# Patient Record
Sex: Female | Born: 1990 | Race: White | Hispanic: No | Marital: Married | Smoking: Never smoker
Health system: Southern US, Community
[De-identification: ages and names within clinical notes are randomized; demographics above are authoritative.]

## PROBLEM LIST (undated history)

## (undated) DIAGNOSIS — Z789 Other specified health status: Secondary | ICD-10-CM

## (undated) HISTORY — PX: MOUTH SURGERY: SHX715

## (undated) HISTORY — PX: NO PAST SURGERIES: SHX2092

---

## 2013-03-02 NOTE — L&D Delivery Note (Signed)
Delivery Note At 7:51 PM a viable female was delivered via SVD. Presentation:OA with mother on hands/knees in birthing tub.  APGAR: 8, 9; weight pending .   Placenta status: spontaneous, intact.  Cord: 3V with the following complications :none.  Anesthesia: locatl 1% xylocaine Episiotomy: None Lacerations: small midline 2nd degree and full thickness left labium minorum Suture Repair: 3.0 vicryl rapide Est. Blood Loss (mL):  300cc  Mom to postpartum.  Baby to Couplet care / Skin to Skin.  Amy Franklin 01/17/2014, 8:34 PM

## 2013-06-16 ENCOUNTER — Ambulatory Visit (INDEPENDENT_AMBULATORY_CARE_PROVIDER_SITE_OTHER): Payer: BC Managed Care – PPO | Admitting: Advanced Practice Midwife

## 2013-06-16 ENCOUNTER — Encounter: Payer: Self-pay | Admitting: Advanced Practice Midwife

## 2013-06-16 VITALS — BP 113/67 | Temp 97.6°F | Ht 60.0 in | Wt 122.0 lb

## 2013-06-16 DIAGNOSIS — Z3400 Encounter for supervision of normal first pregnancy, unspecified trimester: Secondary | ICD-10-CM | POA: Insufficient documentation

## 2013-06-16 NOTE — Progress Notes (Signed)
[id number]   Bedside U/S showed IUP with CRL 23.225mm and GA [redacted]W[redacted]D

## 2013-06-16 NOTE — Progress Notes (Signed)
   Subjective:    Amy Franklin is a G1P0 459w1d being seen today for her first obstetrical visit.  Her obstetrical history is significant for none G1. Patient does intend to breast feed. Pregnancy history fully reviewed.  Patient reports no complaints.  Filed Vitals:   06/16/13 1009 06/16/13 1014  BP: 113/67   Temp: 97.6 F (36.4 C)   Height:  5' (1.524 m)  Weight: 122 lb (55.339 kg)     HISTORY: OB History  Gravida Para Term Preterm AB SAB TAB Ectopic Multiple Living  1             # Outcome Date GA Lbr Len/2nd Weight Sex Delivery Anes PTL Lv  1 CUR              No past medical history on file. Past Surgical History  Procedure Laterality Date  . Mouth surgery     Family History  Problem Relation Age of Onset  . Hypertension Father   . Hypertension Paternal Grandfather   . Cancer - Colon Paternal Grandfather   . Diabetes Maternal Grandfather      Exam    Uterus:     Pelvic Exam: Deferred  System: Breast:  Deferred   Skin: normal coloration and turgor, no rashes    Neurologic: oriented, normal, gait normal; reflexes normal and symmetric   Extremities: normal strength, tone, and muscle mass, ROM of all joints is normal   HEENT neck supple with midline trachea and thyroid without masses   Mouth/Teeth mucous membranes moist, pharynx normal without lesions and dental hygiene good   Neck supple and no masses   Cardiovascular: regular rate and rhythm   Respiratory:  appears well, vitals normal, no respiratory distress, acyanotic, normal RR, ear and throat exam is normal, neck free of mass or lymphadenopathy, chest clear, no wheezing, crepitations, rhonchi, normal symmetric air entry   Abdomen: soft, non-tender; bowel sounds normal; no masses,  no organomegaly   Urinary: urethral meatus normal      Assessment:    Pregnancy: G1P0 Patient Active Problem List   Diagnosis Date Noted  . Supervision of normal first pregnancy 06/16/2013        Plan:     Initial  labs drawn. Prenatal vitamins. Problem list reviewed and updated. Genetic Screening discussed First Screen and Quad Screen: declined.  Ultrasound discussed; fetal survey: requested.  Follow up in 4 weeks. 50% of 30 min visit spent on counseling and coordination of care.     Wilmer FloorLisa A Leftwich-Kirby 06/16/2013

## 2013-06-17 LAB — OBSTETRIC PANEL
Antibody Screen: NEGATIVE
Basophils Absolute: 0 10*3/uL (ref 0.0–0.1)
Basophils Relative: 0 % (ref 0–1)
EOS ABS: 0 10*3/uL (ref 0.0–0.7)
Eosinophils Relative: 0 % (ref 0–5)
HCT: 37.4 % (ref 36.0–46.0)
HEMOGLOBIN: 13.4 g/dL (ref 12.0–15.0)
HEP B S AG: NEGATIVE
LYMPHS PCT: 16 % (ref 12–46)
Lymphs Abs: 1.8 10*3/uL (ref 0.7–4.0)
MCH: 31.3 pg (ref 26.0–34.0)
MCHC: 35.8 g/dL (ref 30.0–36.0)
MCV: 87.4 fL (ref 78.0–100.0)
MONOS PCT: 8 % (ref 3–12)
Monocytes Absolute: 0.9 10*3/uL (ref 0.1–1.0)
Neutro Abs: 8.5 10*3/uL — ABNORMAL HIGH (ref 1.7–7.7)
Neutrophils Relative %: 76 % (ref 43–77)
Platelets: 256 10*3/uL (ref 150–400)
RBC: 4.28 MIL/uL (ref 3.87–5.11)
RDW: 13.6 % (ref 11.5–15.5)
RH TYPE: POSITIVE
RUBELLA: 5.57 {index} — AB (ref <0.90)
WBC: 11.2 10*3/uL — AB (ref 4.0–10.5)

## 2013-06-17 LAB — GC/CHLAMYDIA PROBE AMP
CT PROBE, AMP APTIMA: NEGATIVE
GC Probe RNA: NEGATIVE

## 2013-06-17 LAB — HIV ANTIBODY (ROUTINE TESTING W REFLEX): HIV 1&2 Ab, 4th Generation: NONREACTIVE

## 2013-06-19 LAB — CULTURE, URINE COMPREHENSIVE: Colony Count: 1000

## 2013-07-21 ENCOUNTER — Ambulatory Visit (INDEPENDENT_AMBULATORY_CARE_PROVIDER_SITE_OTHER): Payer: BC Managed Care – PPO | Admitting: Family

## 2013-07-21 VITALS — BP 115/70 | HR 93 | Wt 126.0 lb

## 2013-07-21 DIAGNOSIS — N390 Urinary tract infection, site not specified: Secondary | ICD-10-CM

## 2013-07-21 DIAGNOSIS — Z3400 Encounter for supervision of normal first pregnancy, unspecified trimester: Secondary | ICD-10-CM

## 2013-07-21 NOTE — Progress Notes (Signed)
Reviewed lab results.  Discussed quickening and what to expect.  Schedule anatomy ultrasound for next 4-5 weeks.

## 2013-07-21 NOTE — Addendum Note (Signed)
Addended by: Granville Lewis on: 07/21/2013 09:36 AM   Modules accepted: Orders

## 2013-07-23 LAB — CULTURE, URINE COMPREHENSIVE
COLONY COUNT: NO GROWTH
ORGANISM ID, BACTERIA: NO GROWTH

## 2013-08-15 ENCOUNTER — Ambulatory Visit (INDEPENDENT_AMBULATORY_CARE_PROVIDER_SITE_OTHER): Payer: BC Managed Care – PPO | Admitting: Obstetrics & Gynecology

## 2013-08-15 VITALS — BP 119/76 | HR 87 | Wt 132.0 lb

## 2013-08-15 DIAGNOSIS — R319 Hematuria, unspecified: Secondary | ICD-10-CM

## 2013-08-15 DIAGNOSIS — Z3480 Encounter for supervision of other normal pregnancy, unspecified trimester: Secondary | ICD-10-CM

## 2013-08-15 NOTE — Progress Notes (Signed)
Blood-MOD

## 2013-08-15 NOTE — Progress Notes (Signed)
Routine visit. No problems. Morning sickness has resolved. She declines quad screen and MSAFP. Anatomy scan this week.

## 2013-08-17 LAB — CULTURE, OB URINE

## 2013-08-18 ENCOUNTER — Ambulatory Visit (HOSPITAL_COMMUNITY)
Admission: RE | Admit: 2013-08-18 | Discharge: 2013-08-18 | Disposition: A | Discharge status: Home / Self Care | Payer: BC Managed Care – PPO | Source: Ambulatory Visit | Attending: Family | Admitting: Family

## 2013-08-18 DIAGNOSIS — Z3689 Encounter for other specified antenatal screening: Secondary | ICD-10-CM | POA: Diagnosis present

## 2013-08-18 DIAGNOSIS — Z3400 Encounter for supervision of normal first pregnancy, unspecified trimester: Secondary | ICD-10-CM

## 2013-08-21 ENCOUNTER — Encounter: Payer: Self-pay | Admitting: Family

## 2013-09-12 ENCOUNTER — Ambulatory Visit (INDEPENDENT_AMBULATORY_CARE_PROVIDER_SITE_OTHER): Payer: BC Managed Care – PPO | Admitting: Obstetrics & Gynecology

## 2013-09-12 ENCOUNTER — Encounter: Payer: Self-pay | Admitting: Obstetrics & Gynecology

## 2013-09-12 VITALS — BP 113/59 | HR 86 | Wt 139.0 lb

## 2013-09-12 DIAGNOSIS — Z3400 Encounter for supervision of normal first pregnancy, unspecified trimester: Secondary | ICD-10-CM

## 2013-09-12 DIAGNOSIS — Z3402 Encounter for supervision of normal first pregnancy, second trimester: Secondary | ICD-10-CM

## 2013-09-12 NOTE — Patient Instructions (Signed)
Exercise During Pregnancy It is possible to maintain a healthy exercise program throughout a pregnancy. However, it is important to discuss exercising with your caregiver, so that the two of you may develop an appropriate exercise program. It is important to remember that exercise during pregnancy should be use to maintain one's health and not to lose weight. Strenuous activities should be avoided as the may cause the baby to have difficulty obtaining proper amounts of oxygen. A proper pregnancy exercise plan has many benefits including preparing you for the physical challenges of childbirth by strengthening the muscles that help with childbirth, reducing common backaches, alleviating constipation, improving posture, elevating mood, and promoting better sleep. If possible, begin exercising regularly before you become pregnant and continue through the duration of the pregnancy. It is difficult for a woman to begin an exercise program later in a pregnancy due to enlargement of the uterus and breasts and a shift in the center of gravity. Pregnancy exercise programs should be aimed at improving the muscles of the heart, back, pelvis, and abdomen. GENERAL GUIDELINES  Every woman and pregnancy is different; thus, the level of exercise you can do depends on your health, the conditions of the pregnancy, and activity level before pregnancy. For women who were sedentary before pregnancy, walking is a good way to begin. Use caution while participation in sports during pregnancy, because your center of gravity changes and may affect your balance or that require rapid movements. Always make sure to drink plenty of fluids to avoid dehydration, which may decrease blood flow to your baby. Avoid any activity that has the potential for trauma to the abdomen. If possible try to avoid high-altitude activities, which can deprive you and your baby of oxygen; this may cause premature labor. Talk with your caregiver.  Performing a  proper warm up and cool down are very important. It is important to start slowly and build up to more demanding exercises. Toward the end of an exercise session, gradually slow your activity. Perhaps try and work back through the exercises in reverse order. Check your pulse during peak activity, and discuss with your caregiver an appropriate range of heart rate for activity. Slow down your activity if your heart starts beating faster than the target range recommended by your health care provider. Do not exceed a heart rate of 140 beats per minute. Exercise that is too strenuous may speed up the baby's heartbeat to a dangerous level. In general, if you are able to carry on a conversation comfortably while exercising, your heart rate is probably within the recommended limits. Check to make sure.  You should stop exercising and call your health care provider if you have any unusual symptoms, such as pain, uterine contractions, chest pain, bleeding or fluid leakage from the vagina, dizziness, or shortness of breath. Talk to your health caregiver if you have any questions.  Document Released: 02/16/2005 Document Revised: 05/11/2011 Document Reviewed: 05/31/2008 Exit[employer] Patient Information 2015 [employer]. This information is not intended to replace advice given to you by your health care provider. Make sure you discuss any questions you have with your health care provider.  

## 2013-09-12 NOTE — Progress Notes (Signed)
Pt with no problems. Discussed exercise in pregnancy Needs f/u sono in 4-6 weeks to view outflow tracts.

## 2013-10-02 ENCOUNTER — Ambulatory Visit (HOSPITAL_COMMUNITY)
Admission: RE | Admit: 2013-10-02 | Discharge: 2013-10-02 | Disposition: A | Discharge status: Home / Self Care | Payer: BC Managed Care – PPO | Source: Ambulatory Visit | Attending: Obstetrics & Gynecology | Admitting: Obstetrics & Gynecology

## 2013-10-02 DIAGNOSIS — Z3689 Encounter for other specified antenatal screening: Secondary | ICD-10-CM | POA: Insufficient documentation

## 2013-10-02 DIAGNOSIS — Z3402 Encounter for supervision of normal first pregnancy, second trimester: Secondary | ICD-10-CM

## 2013-10-17 ENCOUNTER — Ambulatory Visit (INDEPENDENT_AMBULATORY_CARE_PROVIDER_SITE_OTHER): Payer: BC Managed Care – PPO | Admitting: Obstetrics & Gynecology

## 2013-10-17 VITALS — BP 114/66 | HR 79 | Wt 149.0 lb

## 2013-10-17 DIAGNOSIS — Z3402 Encounter for supervision of normal first pregnancy, second trimester: Secondary | ICD-10-CM

## 2013-10-17 DIAGNOSIS — Z3400 Encounter for supervision of normal first pregnancy, unspecified trimester: Secondary | ICD-10-CM

## 2013-10-17 NOTE — Progress Notes (Signed)
Reviewed prev sono. No complaints. Reviewed increased weight gain F/u in 4 week then q 2 weeks 28 week labs today

## 2013-10-17 NOTE — Progress Notes (Signed)
Glucola today.

## 2013-10-17 NOTE — Patient Instructions (Signed)
Glucose Tolerance Test During Pregnancy The glucose tolerance test (GTT) or 3-hour glucose test can be used to determine if a woman has diabetes that first begins or is first recognized during pregnancy (gestational diabetes). Typically, a GTT is done after you have had a 1-hour glucose test with results that indicate you possibly have gestational diabetes.  The test takes about 3 hours. There will be a series of blood tests after you drink the sugar-water solution. You must remain at the testing location to make sure that your blood is drawn on time.  LET YOUR HEALTH CARE PROVIDER KNOW ABOUT:  Allergies to food or medicine.  Medicines taken, including vitamins, herbs, eyedrops, over-the-counter medicines, and creams.  Any recent illnesses or infections. BEFORE THE PROCEDURE The GTT is a fasting test, meaning you must stop eating for a certain amount of time. The test will be the most accurate if you have not eaten for 8-12 hours before the test. For this reason, it is recommended that you have this test done in the morning before you have breakfast. PROCEDURE  When you arrive at the lab, a sample of your blood is taken to get your fasting blood glucose level. After your fasting glucose level is determined, you will be given a sugar-water solution to drink. You will be asked to wait in a certain area until your next blood test. The blood tests are done each hour for 3 hours. Stay close to the lab so your blood samples can be taken on time. This is important. If the blood samples are not taken on time, the test will need to be done again on another day.  AFTER THE PROCEDURE  You can eat and drink as usual.   Ask when your test results will be ready. Make sure you get your test results. A positive test is considered when two of the four blood test values are equal or above the normal blood glucose level. Document Released: 08/18/2011 Document Revised: 07/03/2013 Document Reviewed:  06/23/2013 Exit[employer] Patient Information 2015 [employer]. This information is not intended to replace advice given to you by your health care provider. Make sure you discuss any questions you have with your health care provider.  

## 2013-10-18 ENCOUNTER — Telehealth: Payer: Self-pay | Admitting: *Deleted

## 2013-10-18 ENCOUNTER — Encounter: Payer: Self-pay | Admitting: Obstetrics & Gynecology

## 2013-10-18 LAB — RPR

## 2013-10-18 LAB — CBC
HEMATOCRIT: 33.5 % — AB (ref 36.0–46.0)
HEMOGLOBIN: 11.5 g/dL — AB (ref 12.0–15.0)
MCH: 30.9 pg (ref 26.0–34.0)
MCHC: 34.3 g/dL (ref 30.0–36.0)
MCV: 90.1 fL (ref 78.0–100.0)
Platelets: 208 10*3/uL (ref 150–400)
RBC: 3.72 MIL/uL — ABNORMAL LOW (ref 3.87–5.11)
RDW: 14 % (ref 11.5–15.5)
WBC: 9.5 10*3/uL (ref 4.0–10.5)

## 2013-10-18 LAB — GLUCOSE TOLERANCE, 1 HOUR (50G) W/O FASTING: GLUCOSE 1 HOUR GTT: 68 mg/dL — AB (ref 70–140)

## 2013-10-18 LAB — HIV ANTIBODY (ROUTINE TESTING W REFLEX): HIV: NONREACTIVE

## 2013-10-18 NOTE — Telephone Encounter (Signed)
Pt notified of normal 1 hr GTT. 

## 2013-10-26 ENCOUNTER — Telehealth: Payer: Self-pay | Admitting: *Deleted

## 2013-10-26 NOTE — Telephone Encounter (Signed)
Pt called concerned about  Round ligament pain. I suggested that she get a good belly band from either [facility] or a higher end store.  Pt agrees to try this.

## 2013-11-14 ENCOUNTER — Encounter: Payer: Self-pay | Admitting: Obstetrics & Gynecology

## 2013-11-14 ENCOUNTER — Ambulatory Visit (INDEPENDENT_AMBULATORY_CARE_PROVIDER_SITE_OTHER): Payer: BC Managed Care – PPO | Admitting: Obstetrics & Gynecology

## 2013-11-14 VITALS — BP 120/84 | HR 91 | Wt 151.0 lb

## 2013-11-14 DIAGNOSIS — Z23 Encounter for immunization: Secondary | ICD-10-CM | POA: Diagnosis not present

## 2013-11-14 DIAGNOSIS — Z3400 Encounter for supervision of normal first pregnancy, unspecified trimester: Secondary | ICD-10-CM

## 2013-11-14 DIAGNOSIS — Z3403 Encounter for supervision of normal first pregnancy, third trimester: Secondary | ICD-10-CM

## 2013-11-14 NOTE — Progress Notes (Signed)
Routine visit. Good FM. No problems. TDAP and flu vaccines today.

## 2013-11-27 ENCOUNTER — Ambulatory Visit (INDEPENDENT_AMBULATORY_CARE_PROVIDER_SITE_OTHER): Payer: BC Managed Care – PPO | Admitting: Advanced Practice Midwife

## 2013-11-27 VITALS — BP 122/76 | HR 111 | Wt 151.0 lb

## 2013-11-27 DIAGNOSIS — Z3400 Encounter for supervision of normal first pregnancy, unspecified trimester: Secondary | ICD-10-CM

## 2013-11-27 DIAGNOSIS — Z3403 Encounter for supervision of normal first pregnancy, third trimester: Secondary | ICD-10-CM

## 2013-11-27 NOTE — Patient Instructions (Signed)
Braxton Hicks Contractions Contractions of the uterus can occur throughout pregnancy. Contractions are not always a sign that you are in labor.  WHAT ARE BRAXTON HICKS CONTRACTIONS?  Contractions that occur before labor are called Braxton Hicks contractions, or false labor. Toward the end of pregnancy (32-34 weeks), these contractions can develop more often and may become more forceful. This is not true labor because these contractions do not result in opening (dilatation) and thinning of the cervix. They are sometimes difficult to tell apart from true labor because these contractions can be forceful and people have different pain tolerances. You should not feel embarrassed if you go to the hospital with false labor. Sometimes, the only way to tell if you are in true labor is for your health care provider to look for changes in the cervix. If there are no prenatal problems or other health problems associated with the pregnancy, it is completely safe to be sent home with false labor and await the onset of true labor. HOW CAN YOU TELL THE DIFFERENCE BETWEEN TRUE AND FALSE LABOR? False Labor  The contractions of false labor are usually shorter and not as hard as those of true labor.   The contractions are usually irregular.   The contractions are often felt in the front of the lower abdomen and in the groin.   The contractions may go away when you walk around or change positions while lying down.   The contractions get weaker and are shorter lasting as time goes on.   The contractions do not usually become progressively stronger, regular, and closer together as with true labor.  True Labor  Contractions in true labor last 30-70 seconds, become very regular, usually become more intense, and increase in frequency.   The contractions do not go away with walking.   The discomfort is usually felt in the top of the uterus and spreads to the lower abdomen and low back.   True labor can be  determined by your health care provider with an exam. This will show that the cervix is dilating and getting thinner.  WHAT TO REMEMBER  Keep up with your usual exercises and follow other instructions given by your health care provider.   Take medicines as directed by your health care provider.   Keep your regular prenatal appointments.   Eat and drink lightly if you think you are going into labor.   If Braxton Hicks contractions are making you uncomfortable:   Change your position from lying down or resting to walking, or from walking to resting.   Sit and rest in a tub of warm water.   Drink 2-3 glasses of water. Dehydration may cause these contractions.   Do slow and deep breathing several times an hour.  WHEN SHOULD I SEEK IMMEDIATE MEDICAL CARE? Seek immediate medical care if:  Your contractions become stronger, more regular, and closer together.   You have fluid leaking or gushing from your vagina.   You have a fever.   You pass blood-tinged mucus.   You have vaginal bleeding.   You have continuous abdominal pain.   You have low back pain that you never had before.   You feel your baby's head pushing down and causing pelvic pressure.   Your baby is not moving as much as it used to.  Document Released: 02/16/2005 Document Revised: 02/21/2013 Document Reviewed: 11/28/2012 [employer] Patient Information 2015 [employer]. This information is not intended to replace advice given to you by your health care   provider. Make sure you discuss any questions you have with your health care provider.  Fetal Movement Counts Patient Name: __________________________________________________ Patient Due Date: ____________________ Performing a fetal movement count is highly recommended in high-risk pregnancies, but it is good for every pregnant woman to do. Your health care provider may ask you to start counting fetal movements at 28 weeks of the pregnancy. Fetal  movements often increase:  After eating a full meal.  After physical activity.  After eating or drinking something sweet or cold.  At rest. Pay attention to when you feel the baby is most active. This will help you notice a pattern of your baby's sleep and wake cycles and what factors contribute to an increase in fetal movement. It is important to perform a fetal movement count at the same time each day when your baby is normally most active.  HOW TO COUNT FETAL MOVEMENTS 1. Find a quiet and comfortable area to sit or lie down on your left side. Lying on your left side provides the best blood and oxygen circulation to your baby. 2. Write down the day and time on a sheet of paper or in a journal. 3. Start counting kicks, flutters, swishes, rolls, or jabs in a 2-hour period. You should feel at least 10 movements within 2 hours. 4. If you do not feel 10 movements in 2 hours, wait 2-3 hours and count again. Look for a change in the pattern or not enough counts in 2 hours. SEEK MEDICAL CARE IF:  You feel less than 10 counts in 2 hours, tried twice.  There is no movement in over an hour.  The pattern is changing or taking longer each day to reach 10 counts in 2 hours.  You feel the baby is not moving as he or she usually does. Date: ____________ Movements: ____________ Start time: ____________ Finish time: ____________  Date: ____________ Movements: ____________ Start time: ____________ Finish time: ____________ Date: ____________ Movements: ____________ Start time: ____________ Finish time: ____________ Date: ____________ Movements: ____________ Start time: ____________ Finish time: ____________ Date: ____________ Movements: ____________ Start time: ____________ Finish time: ____________ Date: ____________ Movements: ____________ Start time: ____________ Finish time: ____________ Date: ____________ Movements: ____________ Start time: ____________ Finish time: ____________ Date: ____________  Movements: ____________ Start time: ____________ Finish time: ____________  Date: ____________ Movements: ____________ Start time: ____________ Finish time: ____________ Date: ____________ Movements: ____________ Start time: ____________ Finish time: ____________ Date: ____________ Movements: ____________ Start time: ____________ Finish time: ____________ Date: ____________ Movements: ____________ Start time: ____________ Finish time: ____________ Date: ____________ Movements: ____________ Start time: ____________ Finish time: ____________ Date: ____________ Movements: ____________ Start time: ____________ Finish time: ____________ Date: ____________ Movements: ____________ Start time: ____________ Finish time: ____________  Date: ____________ Movements: ____________ Start time: ____________ Finish time: ____________ Date: ____________ Movements: ____________ Start time: ____________ Finish time: ____________ Date: ____________ Movements: ____________ Start time: ____________ Finish time: ____________ Date: ____________ Movements: ____________ Start time: ____________ Finish time: ____________ Date: ____________ Movements: ____________ Start time: ____________ Finish time: ____________ Date: ____________ Movements: ____________ Start time: ____________ Finish time: ____________ Date: ____________ Movements: ____________ Start time: ____________ Finish time: ____________  Date: ____________ Movements: ____________ Start time: ____________ Finish time: ____________ Date: ____________ Movements: ____________ Start time: ____________ Finish time: ____________ Date: ____________ Movements: ____________ Start time: ____________ Finish time: ____________ Date: ____________ Movements: ____________ Start time: ____________ Finish time: ____________ Date: ____________ Movements: ____________ Start time: ____________ Finish time: ____________ Date: ____________ Movements: ____________ Start time:  ____________ Finish time: ____________ Date: ____________ Movements:   ____________ Start time: ____________ Finish time: ____________  Date: ____________ Movements: ____________ Start time: ____________ Finish time: ____________ Date: ____________ Movements: ____________ Start time: ____________ Finish time: ____________ Date: ____________ Movements: ____________ Start time: ____________ Finish time: ____________ Date: ____________ Movements: ____________ Start time: ____________ Finish time: ____________ Date: ____________ Movements: ____________ Start time: ____________ Finish time: ____________ Date: ____________ Movements: ____________ Start time: ____________ Finish time: ____________ Date: ____________ Movements: ____________ Start time: ____________ Finish time: ____________  Date: ____________ Movements: ____________ Start time: ____________ Finish time: ____________ Date: ____________ Movements: ____________ Start time: ____________ Finish time: ____________ Date: ____________ Movements: ____________ Start time: ____________ Finish time: ____________ Date: ____________ Movements: ____________ Start time: ____________ Finish time: ____________ Date: ____________ Movements: ____________ Start time: ____________ Finish time: ____________ Date: ____________ Movements: ____________ Start time: ____________ Finish time: ____________ Date: ____________ Movements: ____________ Start time: ____________ Finish time: ____________  Date: ____________ Movements: ____________ Start time: ____________ Finish time: ____________ Date: ____________ Movements: ____________ Start time: ____________ Finish time: ____________ Date: ____________ Movements: ____________ Start time: ____________ Finish time: ____________ Date: ____________ Movements: ____________ Start time: ____________ Finish time: ____________ Date: ____________ Movements: ____________ Start time: ____________ Finish time: ____________ Date:  ____________ Movements: ____________ Start time: ____________ Finish time: ____________ Date: ____________ Movements: ____________ Start time: ____________ Finish time: ____________  Date: ____________ Movements: ____________ Start time: ____________ Finish time: ____________ Date: ____________ Movements: ____________ Start time: ____________ Finish time: ____________ Date: ____________ Movements: ____________ Start time: ____________ Finish time: ____________ Date: ____________ Movements: ____________ Start time: ____________ Finish time: ____________ Date: ____________ Movements: ____________ Start time: ____________ Finish time: ____________ Date: ____________ Movements: ____________ Start time: ____________ Finish time: ____________ Document Released: 03/18/2006 Document Revised: 07/03/2013 Document Reviewed: 12/14/2011 [employer] Patient Information 2015 [employer]. This information is not intended to replace advice given to you by your health care provider. Make sure you discuss any questions you have with your health care provider.  

## 2013-11-28 ENCOUNTER — Encounter: Payer: Self-pay | Admitting: *Deleted

## 2013-12-11 ENCOUNTER — Ambulatory Visit (INDEPENDENT_AMBULATORY_CARE_PROVIDER_SITE_OTHER): Payer: BC Managed Care – PPO | Admitting: Advanced Practice Midwife

## 2013-12-11 VITALS — BP 124/77 | HR 97 | Wt 153.0 lb

## 2013-12-11 DIAGNOSIS — Z3403 Encounter for supervision of normal first pregnancy, third trimester: Secondary | ICD-10-CM

## 2013-12-11 NOTE — Progress Notes (Signed)
Doing well.  Good fetal movement, denies vaginal bleeding, LOF, regular contractions.  Having some bilateral lower abdominal pain with movement.  Discussed round ligament pain, recommend good ergonomics, rest/ice/heat/warm bath PRN.  Waterbirth consent/research study reviewed, paperwork signed today.

## 2013-12-14 ENCOUNTER — Ambulatory Visit (INDEPENDENT_AMBULATORY_CARE_PROVIDER_SITE_OTHER): Payer: BC Managed Care – PPO | Admitting: Advanced Practice Midwife

## 2013-12-14 VITALS — BP 135/72 | HR 91 | Wt 153.0 lb

## 2013-12-14 DIAGNOSIS — O26893 Other specified pregnancy related conditions, third trimester: Secondary | ICD-10-CM

## 2013-12-14 DIAGNOSIS — N898 Other specified noninflammatory disorders of vagina: Secondary | ICD-10-CM

## 2013-12-14 NOTE — Progress Notes (Signed)
Pt reports pelvic cramping off and on x 2 nights with onset yesterday of bright red spotting on toilet paper when wiping.  Good fetal movement, no LOF, regular contractions.  No vaginal itching/discharge.  No recent changes in activity or recent intercourse.  No constipation or urinary symptoms.  SSE with no blood in vault, wet prep collected.  No bleeding on glove following exam.  Cervix 1/thick/posterior.  Precautions given, reasons to go to hospital.

## 2013-12-14 NOTE — Progress Notes (Signed)
Having light bright red spotting and some lower pelvic cramping.

## 2013-12-15 ENCOUNTER — Telehealth: Payer: Self-pay | Admitting: *Deleted

## 2013-12-15 DIAGNOSIS — N760 Acute vaginitis: Principal | ICD-10-CM

## 2013-12-15 DIAGNOSIS — B9689 Other specified bacterial agents as the cause of diseases classified elsewhere: Secondary | ICD-10-CM

## 2013-12-15 LAB — WET PREP FOR TRICH, YEAST, CLUE
Trich, Wet Prep: NONE SEEN
Yeast Wet Prep HPF POC: NONE SEEN

## 2013-12-15 MED ORDER — METRONIDAZOLE 500 MG PO TABS: 500.0000 mg | ORAL_TABLET | Freq: Two times a day (BID) | ORAL | Status: DC

## 2013-12-15 NOTE — Telephone Encounter (Signed)
Pt called in about lab results. I adv positive Clue cells. Pt still having Sx. Flagyl sent to pharm.

## 2013-12-25 ENCOUNTER — Ambulatory Visit (INDEPENDENT_AMBULATORY_CARE_PROVIDER_SITE_OTHER): Payer: BC Managed Care – PPO | Admitting: Advanced Practice Midwife

## 2013-12-25 VITALS — BP 123/80 | HR 104 | Wt 155.0 lb

## 2013-12-25 DIAGNOSIS — Z36 Encounter for antenatal screening of mother: Secondary | ICD-10-CM

## 2013-12-25 DIAGNOSIS — Z3493 Encounter for supervision of normal pregnancy, unspecified, third trimester: Secondary | ICD-10-CM

## 2013-12-25 LAB — OB RESULTS CONSOLE GC/CHLAMYDIA
Chlamydia: NEGATIVE
GC PROBE AMP, GENITAL: NEGATIVE

## 2013-12-25 NOTE — Patient Instructions (Signed)

## 2013-12-25 NOTE — Progress Notes (Signed)
GBS done. Labor precautions reviewed. Ready for waterbirth.

## 2013-12-26 LAB — GC/CHLAMYDIA PROBE AMP
CT PROBE, AMP APTIMA: NEGATIVE
GC PROBE AMP APTIMA: NEGATIVE

## 2013-12-27 LAB — CULTURE, BETA STREP (GROUP B ONLY)

## 2013-12-30 ENCOUNTER — Encounter (HOSPITAL_COMMUNITY): Payer: Self-pay | Admitting: Advanced Practice Midwife

## 2014-01-01 ENCOUNTER — Ambulatory Visit (INDEPENDENT_AMBULATORY_CARE_PROVIDER_SITE_OTHER): Payer: BC Managed Care – PPO | Admitting: Advanced Practice Midwife

## 2014-01-01 ENCOUNTER — Encounter: Payer: Self-pay | Admitting: Obstetrics & Gynecology

## 2014-01-01 VITALS — BP 126/78 | HR 84 | Wt 155.0 lb

## 2014-01-01 DIAGNOSIS — Z3493 Encounter for supervision of normal pregnancy, unspecified, third trimester: Secondary | ICD-10-CM

## 2014-01-01 NOTE — Patient Instructions (Signed)

## 2014-01-01 NOTE — Progress Notes (Signed)
[facility]

## 2014-01-01 NOTE — Progress Notes (Signed)
Has intermittent low back pain. Wonders what it is. Discussed how to feel belly to verify if it is a contraction.

## 2014-01-08 ENCOUNTER — Ambulatory Visit (INDEPENDENT_AMBULATORY_CARE_PROVIDER_SITE_OTHER): Payer: BC Managed Care – PPO | Admitting: Advanced Practice Midwife

## 2014-01-08 VITALS — BP 120/69 | HR 79 | Wt 155.0 lb

## 2014-01-08 DIAGNOSIS — Z3493 Encounter for supervision of normal pregnancy, unspecified, third trimester: Secondary | ICD-10-CM

## 2014-01-08 NOTE — Progress Notes (Signed)
Reviewed signs of labor. Not having many contractions yet. Reviewed fetal movement.

## 2014-01-08 NOTE — Patient Instructions (Signed)

## 2014-01-11 ENCOUNTER — Inpatient Hospital Stay (HOSPITAL_COMMUNITY)
Admission: AD | Admit: 2014-01-11 | Discharge: 2014-01-11 | Disposition: A | Discharge status: Home / Self Care | Payer: BC Managed Care – PPO | Source: Ambulatory Visit | Attending: Obstetrics & Gynecology | Admitting: Obstetrics & Gynecology

## 2014-01-11 ENCOUNTER — Encounter (HOSPITAL_COMMUNITY): Payer: Self-pay | Admitting: *Deleted

## 2014-01-11 ENCOUNTER — Ambulatory Visit (INDEPENDENT_AMBULATORY_CARE_PROVIDER_SITE_OTHER): Payer: BC Managed Care – PPO | Admitting: Obstetrics & Gynecology

## 2014-01-11 VITALS — BP 133/85 | HR 110 | Wt 154.0 lb

## 2014-01-11 DIAGNOSIS — Z3403 Encounter for supervision of normal first pregnancy, third trimester: Secondary | ICD-10-CM

## 2014-01-11 DIAGNOSIS — O471 False labor at or after 37 completed weeks of gestation: Secondary | ICD-10-CM | POA: Insufficient documentation

## 2014-01-11 DIAGNOSIS — Z3A39 39 weeks gestation of pregnancy: Secondary | ICD-10-CM

## 2014-01-11 NOTE — Progress Notes (Signed)
Pt states she thinks she is having regular contractions every 10 minutes

## 2014-01-11 NOTE — MAU Note (Signed)
Pt reports contractions all afternoon, seen in office and was 1/70. Reports good fetal movement.

## 2014-01-11 NOTE — Progress Notes (Signed)
Work in visit because she is having some contractions. Denies VB or ROM. She reports good FM. NST reviewed and reactive. Labor precautions reviewed.

## 2014-01-15 ENCOUNTER — Ambulatory Visit (INDEPENDENT_AMBULATORY_CARE_PROVIDER_SITE_OTHER): Payer: BC Managed Care – PPO | Admitting: Advanced Practice Midwife

## 2014-01-15 VITALS — BP 121/77 | HR 93 | Wt 155.0 lb

## 2014-01-15 DIAGNOSIS — Z3403 Encounter for supervision of normal first pregnancy, third trimester: Secondary | ICD-10-CM

## 2014-01-15 NOTE — Patient Instructions (Signed)

## 2014-01-15 NOTE — Progress Notes (Signed)
Excited to have baby. Wants sweeping, done. Cervix more anterior

## 2014-01-17 ENCOUNTER — Inpatient Hospital Stay (HOSPITAL_COMMUNITY)
Admission: AD | Admit: 2014-01-17 | Discharge: 2014-01-17 | Disposition: A | Discharge status: Home / Self Care | Payer: BC Managed Care – PPO | Source: Ambulatory Visit | Attending: Obstetrics & Gynecology | Admitting: Obstetrics & Gynecology

## 2014-01-17 ENCOUNTER — Inpatient Hospital Stay (HOSPITAL_COMMUNITY)
Admission: AD | Admit: 2014-01-17 | Discharge: 2014-01-19 | DRG: 775 | Disposition: A | Discharge status: Home / Self Care | Payer: BC Managed Care – PPO | Source: Ambulatory Visit | Attending: Obstetrics and Gynecology | Admitting: Obstetrics and Gynecology

## 2014-01-17 ENCOUNTER — Encounter (HOSPITAL_COMMUNITY): Payer: Self-pay | Admitting: *Deleted

## 2014-01-17 DIAGNOSIS — Z8249 Family history of ischemic heart disease and other diseases of the circulatory system: Secondary | ICD-10-CM | POA: Diagnosis not present

## 2014-01-17 DIAGNOSIS — Z3A40 40 weeks gestation of pregnancy: Secondary | ICD-10-CM | POA: Diagnosis present

## 2014-01-17 DIAGNOSIS — O471 False labor at or after 37 completed weeks of gestation: Secondary | ICD-10-CM | POA: Insufficient documentation

## 2014-01-17 DIAGNOSIS — IMO0001 Reserved for inherently not codable concepts without codable children: Secondary | ICD-10-CM

## 2014-01-17 DIAGNOSIS — Z833 Family history of diabetes mellitus: Secondary | ICD-10-CM | POA: Diagnosis not present

## 2014-01-17 DIAGNOSIS — Z3403 Encounter for supervision of normal first pregnancy, third trimester: Secondary | ICD-10-CM

## 2014-01-17 LAB — TYPE AND SCREEN
ABO/RH(D): A POS
Antibody Screen: NEGATIVE

## 2014-01-17 LAB — CBC
HEMATOCRIT: 37.6 % (ref 36.0–46.0)
Hemoglobin: 13.2 g/dL (ref 12.0–15.0)
MCH: 30 pg (ref 26.0–34.0)
MCHC: 35.1 g/dL (ref 30.0–36.0)
MCV: 85.5 fL (ref 78.0–100.0)
Platelets: 159 10*3/uL (ref 150–400)
RBC: 4.4 MIL/uL (ref 3.87–5.11)
RDW: 13.2 % (ref 11.5–15.5)
WBC: 15.3 10*3/uL — ABNORMAL HIGH (ref 4.0–10.5)

## 2014-01-17 LAB — ABO/RH: ABO/RH(D): A POS

## 2014-01-17 LAB — OB RESULTS CONSOLE GBS: STREP GROUP B AG: NEGATIVE

## 2014-01-17 LAB — RPR

## 2014-01-17 MED ORDER — DIPHENHYDRAMINE HCL 25 MG PO CAPS: 25.0000 mg | ORAL_CAPSULE | Freq: Four times a day (QID) | ORAL | Status: DC | PRN

## 2014-01-17 MED ORDER — ZOLPIDEM TARTRATE 5 MG PO TABS: 5.0000 mg | ORAL_TABLET | Freq: Every evening | ORAL | Status: DC | PRN

## 2014-01-17 MED ORDER — WITCH HAZEL-GLYCERIN EX PADS: 1.0000 "application " | MEDICATED_PAD | CUTANEOUS | Status: DC | PRN

## 2014-01-17 MED ORDER — CITRIC ACID-SODIUM CITRATE 334-500 MG/5ML PO SOLN: 30.0000 mL | ORAL | Status: DC | PRN

## 2014-01-17 MED ORDER — LACTATED RINGERS IV SOLN: 500.0000 mL | INTRAVENOUS | Status: DC | PRN

## 2014-01-17 MED ORDER — IBUPROFEN 600 MG PO TABS
600.0000 mg | ORAL_TABLET | Freq: Four times a day (QID) | ORAL | Status: DC
  Administered 2014-01-18 – 2014-01-19 (×6): 600 mg via ORAL
  Filled 2014-01-17 (×7): qty 1

## 2014-01-17 MED ORDER — SIMETHICONE 80 MG PO CHEW
80.0000 mg | CHEWABLE_TABLET | ORAL | Status: DC | PRN
Start: 2014-01-17 — End: 2014-01-19

## 2014-01-17 MED ORDER — PRENATAL MULTIVITAMIN CH
1.0000 | ORAL_TABLET | Freq: Every day | ORAL | Status: DC
  Administered 2014-01-18 – 2014-01-19 (×2): 1 via ORAL
  Filled 2014-01-17 (×2): qty 1

## 2014-01-17 MED ORDER — ONDANSETRON HCL 4 MG/2ML IJ SOLN: 4.0000 mg | Freq: Four times a day (QID) | INTRAMUSCULAR | Status: DC | PRN

## 2014-01-17 MED ORDER — OXYCODONE-ACETAMINOPHEN 5-325 MG PO TABS: 2.0000 | ORAL_TABLET | ORAL | Status: DC | PRN

## 2014-01-17 MED ORDER — OXYCODONE-ACETAMINOPHEN 5-325 MG PO TABS: 1.0000 | ORAL_TABLET | ORAL | Status: DC | PRN

## 2014-01-17 MED ORDER — LIDOCAINE HCL (PF) 1 % IJ SOLN
30.0000 mL | INTRAMUSCULAR | Status: DC | PRN
  Administered 2014-01-17: 30 mL via SUBCUTANEOUS
  Filled 2014-01-17: qty 30

## 2014-01-17 MED ORDER — LANOLIN HYDROUS EX OINT: TOPICAL_OINTMENT | CUTANEOUS | Status: DC | PRN

## 2014-01-17 MED ORDER — ACETAMINOPHEN 325 MG PO TABS: 650.0000 mg | ORAL_TABLET | ORAL | Status: DC | PRN

## 2014-01-17 MED ORDER — ONDANSETRON HCL 4 MG/2ML IJ SOLN: 4.0000 mg | INTRAMUSCULAR | Status: DC | PRN

## 2014-01-17 MED ORDER — FLEET ENEMA 7-19 GM/118ML RE ENEM: 1.0000 | ENEMA | RECTAL | Status: DC | PRN

## 2014-01-17 MED ORDER — ONDANSETRON HCL 4 MG PO TABS
4.0000 mg | ORAL_TABLET | ORAL | Status: DC | PRN
Start: 2014-01-17 — End: 2014-01-19

## 2014-01-17 MED ORDER — OXYCODONE-ACETAMINOPHEN 5-325 MG PO TABS
2.0000 | ORAL_TABLET | Freq: Once | ORAL | Status: AC
  Administered 2014-01-17: 2 via ORAL
  Filled 2014-01-17: qty 2

## 2014-01-17 MED ORDER — OXYTOCIN 40 UNITS IN LACTATED RINGERS INFUSION - SIMPLE MED: 62.5000 mL/h | INTRAVENOUS | Status: DC

## 2014-01-17 MED ORDER — LACTATED RINGERS IV SOLN
INTRAVENOUS | Status: DC
Start: 2014-01-17 — End: 2014-01-17

## 2014-01-17 MED ORDER — LAVENDER OIL OIL: 1.0000 "application " | TOPICAL_OIL | Status: DC | PRN

## 2014-01-17 MED ORDER — SENNOSIDES-DOCUSATE SODIUM 8.6-50 MG PO TABS
2.0000 | ORAL_TABLET | ORAL | Status: DC
  Administered 2014-01-19: 2 via ORAL
  Filled 2014-01-17 (×2): qty 2

## 2014-01-17 MED ORDER — BENZOCAINE-MENTHOL 20-0.5 % EX AERO: 1.0000 "application " | INHALATION_SPRAY | CUTANEOUS | Status: DC | PRN

## 2014-01-17 MED ORDER — OXYTOCIN BOLUS FROM INFUSION: 500.0000 mL | INTRAVENOUS | Status: DC

## 2014-01-17 MED ORDER — DIBUCAINE 1 % RE OINT: 1.0000 "application " | TOPICAL_OINTMENT | RECTAL | Status: DC | PRN

## 2014-01-17 NOTE — Discharge Instructions (Signed)
Braxton Hicks Contractions °Contractions of the uterus can occur throughout pregnancy. Contractions are not always a sign that you are in labor.  °WHAT ARE BRAXTON HICKS CONTRACTIONS?  °Contractions that occur before labor are called Braxton Hicks contractions, or false labor. Toward the end of pregnancy (32-34 weeks), these contractions can develop more often and may become more forceful. This is not true labor because these contractions do not result in opening (dilatation) and thinning of the cervix. They are sometimes difficult to tell apart from true labor because these contractions can be forceful and people have different pain tolerances. You should not feel embarrassed if you go to the hospital with false labor. Sometimes, the only way to tell if you are in true labor is for your health care provider to look for changes in the cervix. °If there are no prenatal problems or other health problems associated with the pregnancy, it is completely safe to be sent home with false labor and await the onset of true labor. °HOW CAN YOU TELL THE DIFFERENCE BETWEEN TRUE AND FALSE LABOR? °False Labor °· The contractions of false labor are usually shorter and not as hard as those of true labor.   °· The contractions are usually irregular.   °· The contractions are often felt in the front of the lower abdomen and in the groin.   °· The contractions may go away when you walk around or change positions while lying down.   °· The contractions get weaker and are shorter lasting as time goes on.   °· The contractions do not usually become progressively stronger, regular, and closer together as with true labor.   °True Labor °1. Contractions in true labor last 30-70 seconds, become very regular, usually become more intense, and increase in frequency.   °2. The contractions do not go away with walking.   °3. The discomfort is usually felt in the top of the uterus and spreads to the lower abdomen and low back.   °4. True labor can  be determined by your health care provider with an exam. This will show that the cervix is dilating and getting thinner.   °WHAT TO REMEMBER °· Keep up with your usual exercises and follow other instructions given by your health care provider.   °· Take medicines as directed by your health care provider.   °· Keep your regular prenatal appointments.   °· Eat and drink lightly if you think you are going into labor.   °· If Braxton Hicks contractions are making you uncomfortable:   °· Change your position from lying down or resting to walking, or from walking to resting.   °· Sit and rest in a tub of warm water.   °· Drink 2-3 glasses of water. Dehydration may cause these contractions.   °· Do slow and deep breathing several times an hour.   °WHEN SHOULD I SEEK IMMEDIATE MEDICAL CARE? °Seek immediate medical care if: °· Your contractions become stronger, more regular, and closer together.   °· You have fluid leaking or gushing from your vagina.   °· You have a fever.   °· You pass blood-tinged mucus.   °· You have vaginal bleeding.   °· You have continuous abdominal pain.   °· You have low back pain that you never had before.   °· You feel your baby's head pushing down and causing pelvic pressure.   °· Your baby is not moving as much as it used to.   °Document Released: 02/16/2005 Document Revised: 02/21/2013 Document Reviewed: 11/28/2012 °ExitCare® Patient Information ©2015 [employer]. This information is not intended to replace advice given to you by your health care   provider. Make sure you discuss any questions you have with your health care provider. ° °Fetal Movement Counts °Patient Name: __________________________________________________ Patient Due Date: ____________________ °Performing a fetal movement count is highly recommended in high-risk pregnancies, but it is good for every pregnant woman to do. Your health care provider may ask you to start counting fetal movements at 28 weeks of the pregnancy. Fetal  movements often increase: °· After eating a full meal. °· After physical activity. °· After eating or drinking something sweet or cold. °· At rest. °Pay attention to when you feel the baby is most active. This will help you notice a pattern of your baby's sleep and wake cycles and what factors contribute to an increase in fetal movement. It is important to perform a fetal movement count at the same time each day when your baby is normally most active.  °HOW TO COUNT FETAL MOVEMENTS °5. Find a quiet and comfortable area to sit or lie down on your left side. Lying on your left side provides the best blood and oxygen circulation to your baby. °6. Write down the day and time on a sheet of paper or in a journal. °7. Start counting kicks, flutters, swishes, rolls, or jabs in a 2-hour period. You should feel at least 10 movements within 2 hours. °8. If you do not feel 10 movements in 2 hours, wait 2-3 hours and count again. Look for a change in the pattern or not enough counts in 2 hours. °SEEK MEDICAL CARE IF: °· You feel less than 10 counts in 2 hours, tried twice. °· There is no movement in over an hour. °· The pattern is changing or taking longer each day to reach 10 counts in 2 hours. °· You feel the baby is not moving as he or she usually does. °Date: ____________ Movements: ____________ Start time: ____________ Finish time: ____________  °Date: ____________ Movements: ____________ Start time: ____________ Finish time: ____________ °Date: ____________ Movements: ____________ Start time: ____________ Finish time: ____________ °Date: ____________ Movements: ____________ Start time: ____________ Finish time: ____________ °Date: ____________ Movements: ____________ Start time: ____________ Finish time: ____________ °Date: ____________ Movements: ____________ Start time: ____________ Finish time: ____________ °Date: ____________ Movements: ____________ Start time: ____________ Finish time: ____________ °Date: ____________  Movements: ____________ Start time: ____________ Finish time: ____________  °Date: ____________ Movements: ____________ Start time: ____________ Finish time: ____________ °Date: ____________ Movements: ____________ Start time: ____________ Finish time: ____________ °Date: ____________ Movements: ____________ Start time: ____________ Finish time: ____________ °Date: ____________ Movements: ____________ Start time: ____________ Finish time: ____________ °Date: ____________ Movements: ____________ Start time: ____________ Finish time: ____________ °Date: ____________ Movements: ____________ Start time: ____________ Finish time: ____________ °Date: ____________ Movements: ____________ Start time: ____________ Finish time: ____________  °Date: ____________ Movements: ____________ Start time: ____________ Finish time: ____________ °Date: ____________ Movements: ____________ Start time: ____________ Finish time: ____________ °Date: ____________ Movements: ____________ Start time: ____________ Finish time: ____________ °Date: ____________ Movements: ____________ Start time: ____________ Finish time: ____________ °Date: ____________ Movements: ____________ Start time: ____________ Finish time: ____________ °Date: ____________ Movements: ____________ Start time: ____________ Finish time: ____________ °Date: ____________ Movements: ____________ Start time: ____________ Finish time: ____________  °Date: ____________ Movements: ____________ Start time: ____________ Finish time: ____________ °Date: ____________ Movements: ____________ Start time: ____________ Finish time: ____________ °Date: ____________ Movements: ____________ Start time: ____________ Finish time: ____________ °Date: ____________ Movements: ____________ Start time: ____________ Finish time: ____________ °Date: ____________ Movements: ____________ Start time: ____________ Finish time: ____________ °Date: ____________ Movements: ____________ Start time:  ____________ Finish time: ____________ °Date: ____________ Movements:   ____________ Start time: ____________ Finish time: ____________  °Date: ____________ Movements: ____________ Start time: ____________ Finish time: ____________ °Date: ____________ Movements: ____________ Start time: ____________ Finish time: ____________ °Date: ____________ Movements: ____________ Start time: ____________ Finish time: ____________ °Date: ____________ Movements: ____________ Start time: ____________ Finish time: ____________ °Date: ____________ Movements: ____________ Start time: ____________ Finish time: ____________ °Date: ____________ Movements: ____________ Start time: ____________ Finish time: ____________ °Date: ____________ Movements: ____________ Start time: ____________ Finish time: ____________  °Date: ____________ Movements: ____________ Start time: ____________ Finish time: ____________ °Date: ____________ Movements: ____________ Start time: ____________ Finish time: ____________ °Date: ____________ Movements: ____________ Start time: ____________ Finish time: ____________ °Date: ____________ Movements: ____________ Start time: ____________ Finish time: ____________ °Date: ____________ Movements: ____________ Start time: ____________ Finish time: ____________ °Date: ____________ Movements: ____________ Start time: ____________ Finish time: ____________ °Date: ____________ Movements: ____________ Start time: ____________ Finish time: ____________  °Date: ____________ Movements: ____________ Start time: ____________ Finish time: ____________ °Date: ____________ Movements: ____________ Start time: ____________ Finish time: ____________ °Date: ____________ Movements: ____________ Start time: ____________ Finish time: ____________ °Date: ____________ Movements: ____________ Start time: ____________ Finish time: ____________ °Date: ____________ Movements: ____________ Start time: ____________ Finish time: ____________ °Date:  ____________ Movements: ____________ Start time: ____________ Finish time: ____________ °Date: ____________ Movements: ____________ Start time: ____________ Finish time: ____________  °Date: ____________ Movements: ____________ Start time: ____________ Finish time: ____________ °Date: ____________ Movements: ____________ Start time: ____________ Finish time: ____________ °Date: ____________ Movements: ____________ Start time: ____________ Finish time: ____________ °Date: ____________ Movements: ____________ Start time: ____________ Finish time: ____________ °Date: ____________ Movements: ____________ Start time: ____________ Finish time: ____________ °Date: ____________ Movements: ____________ Start time: ____________ Finish time: ____________ °Document Released: 03/18/2006 Document Revised: 07/03/2013 Document Reviewed: 12/14/2011 °[employer]Care® Patient Information ©2015 [employer]. This information is not intended to replace advice given to you by your health care provider. Make sure you discuss any questions you have with your health care provider. ° °

## 2014-01-17 NOTE — H&P (Signed)
Anastacia Birdena CrandallRainey is a 23 y.o. female G1 at 3063w6d presenting for labor; desires waterbirth  Maternal Medical History:  Reason for admission: Nausea.     UCs since late yesterday with labor check here last night: cx 1.5/70. Had Percocet and rested. UCs stronger today.  [facility]   OB History    Gravida Para Term Preterm AB TAB SAB Ectopic Multiple Living   1              History reviewed. No pertinent past medical history. Past Surgical History  Procedure Laterality Date  . Mouth surgery     Family History: family history includes Cancer - Colon in her paternal grandfather; Diabetes in her maternal grandfather; Hypertension in her father and paternal grandfather. Social History:  reports that she has never smoked. She has never used smokeless tobacco. She reports that she drinks alcohol. She reports that she does not use illicit drugs.   Prenatal Transfer Tool  Maternal Diabetes: No Genetic Screening: Normal Maternal Ultrasounds/Referrals: Normal Fetal Ultrasounds or other Referrals:  None Maternal Substance Abuse:  No Significant Maternal Medications:  None Significant Maternal Lab Results:  Lab values include: Group B Strep negative Other Comments:  None  Review of Systems  Constitutional: Negative for fever and malaise/fatigue.  Respiratory: Positive for cough.   Gastrointestinal: Negative for nausea and vomiting.  Genitourinary: Negative for dysuria.  Neurological: Negative for weakness and headaches.  Psychiatric/Behavioral: Negative for depression. The patient is not nervous/anxious.     Dilation: 4 Effacement (%): 90 Station: -2 Exam by:: Morrison Oldee Carter RN Blood pressure 125/86, pulse 95, temperature 98.4 F (36.9 C), temperature source Oral, resp. rate 20, height 5' (1.524 m), weight 70.761 kg (156 lb), last menstrual period 04/13/2013. Maternal Exam:  Uterine Assessment: Contraction strength is moderate.  Contraction frequency is  regular.   Abdomen: Estimated fetal weight is 7#.   Fetal presentation: vertex  Introitus: Normal vulva. Normal vagina.  Ferning test: not done.  Nitrazine test: not done. Amniotic fluid character: not assessed.  Pelvis: adequate for delivery.   Cervix: Cervix evaluated by digital exam.   5/90/-2 intact, bloody show per RN exam  Fetal Exam Fetal Monitor Review: Mode: ultrasound.   Baseline rate: 130.  Variability: moderate (6-25 bpm).   Pattern: accelerations present and no decelerations.    Fetal State Assessment: Category I - tracings are normal.     Physical Exam  Nursing note and vitals reviewed. Constitutional: She is oriented to person, place, and time. She appears well-developed and well-nourished. No distress.  HENT:  Head: Normocephalic.  Eyes: Pupils are equal, round, and reactive to light.  Neck: Neck supple. No thyromegaly present.  Cardiovascular: Normal rate, regular rhythm and normal heart sounds.   Respiratory: Effort normal and breath sounds normal.  GI: Soft. There is no tenderness.  Musculoskeletal: Normal range of motion.  Neurological: She is alert and oriented to person, place, and time. She has normal reflexes.  Skin: Skin is warm and dry.  Psychiatric: She has a normal mood and affect. Her behavior is normal. Judgment and thought content normal.    Filed Vitals:   01/17/14 1332 01/17/14 1413 01/17/14 1418 01/17/14 1419  BP: 113/74  125/86   Pulse: 117  95   Temp: 97.4 F (36.3 C)  98.4 F (36.9 C)   TempSrc: Oral Oral Oral   Resp: 18  20   Height:    5' (1.524 m)  Weight:  70.761 kg (156 lb)   Prenatal labs: ABO, Rh: A/POS/-- (04/17 1144) Antibody: NEG (04/17 1144) Rubella: 5.57 (04/17 1144) RPR: NON REAC (08/18 1546)  HBsAg: NEGATIVE (04/17 1144)  HIV: NONREACTIVE (08/18 1546)  GBS: Negative (11/18 0000)  1 hr OGCT 68  Assessment/Plan: G1 at 637w6d with SOL, early active phase> expectant management Category 1 FHR Waterbirth  consents reviewed   Arthor Gorter 01/17/2014, 2:57 PM

## 2014-01-17 NOTE — MAU Note (Signed)
PT SAYS SHE HURT BAD  SINCE 1030.     [facility]-   [address] -  Monday-   1-2 CM  .   DENIES HSV AND MRSA.       GBS- NEG       WANTS WATER BIRTH

## 2014-01-17 NOTE — Progress Notes (Signed)
Patient ID: Amy Franklin, female   DOB: 1990/04/16, 23 y.o.   MRN: [id number] Amy Franklin is a 23 y.o. G1P0 at 8140w6d admitted for SOL  Subjective: Comfortable between UCs and coping well, in and out of birthing tub.  Objective: BP 140/84 mmHg  Pulse 95  Temp(Src) 98.4 F (36.9 C) (Oral)  Resp 18  Ht 5' (1.524 m)  Wt 70.761 kg (156 lb)  BMI 30.47 kg/m2  LMP 04/13/2013 Filed Vitals:   01/17/14 1419 01/17/14 1555 01/17/14 1711 01/17/14 1807  BP:  122/78 119/83 140/84  Pulse:  94 101 95  Temp:  98.4 F (36.9 C)    TempSrc:      Resp:  18 20 18   Height: 5' (1.524 m)     Weight: 70.761 kg (156 lb)      Intermittent Fetal Heart Monitoring:  FHR: 145 bpm, variability: moderate,  accelerations:  Present,  decelerations:  Absent   Contractions: q 2-4 min  SVE:   Dilation: 7.5 Effacement (%): 100 Station: 0 Exam by:: d.Cassidee Deats,cnm  Assessment / Plan:  Labor: Active phase progressive> continue encouragement and expectant management Fetal Wellbeing: Category 1 Pain Control:  Declines meds Expected mode of delivery: NSVD  Dayshon Roback 01/17/2014, 6:23 PM

## 2014-01-17 NOTE — Progress Notes (Signed)
Patient ID: Amy Franklin, female   DOB: [birth year], 23 y.o.   MRN: [id number] Amy Franklin is a 23 y.o. G1P0 at 220w6d admitted for SOL  Subjective: . SROM large amount clear AF at 1825. Urge to push now.  Objective: BP 126/78 mmHg  Pulse 100  Temp(Src) 98.9 F (37.2 C) (Oral)  Resp 20  Ht 5' (1.524 m)  Wt 70.761 kg (156 lb)  BMI 30.47 kg/m2  LMP 04/13/2013  Fetal Heart q 5 min, pt in tub FHR: 150 bpm, variability: moderate,  accelerations:  Present,  decelerations:  Absent   Contractions: q 2-3  SVE:   Dilation: 8.5 Effacement (%): 100 Station: 0, +1 Exam by:: foley,rn SVE: C/C +2/ LOA Assessment / Plan:  Labor: 2nd stage Fetal Wellbeing: Category 1 Pain Control:  coping Expected mode of delivery: NSVD  POE,DEIRDRE 01/17/2014, 7:34 PM

## 2014-01-17 NOTE — MAU Provider Note (Signed)
Amy Franklin 23 y.o. G1P0  At 960w6d seen for labor check during the night and cx 1.5/70, returns with increased frequency and painful UCs, bloody show. Per RN cx 4/90/bulging> admit for labor management. Danae Orleanseirdre C Lehi Phifer, CNM 01/17/2014 2:00 PM

## 2014-01-17 NOTE — MAU Note (Signed)
C/o ucs since 2300 last night; ucs got stronger @ 1100 this AM; denies SROM;

## 2014-01-18 NOTE — Lactation Note (Signed)
This note was copied from the chart of Amy Franklin. Lactation Consultation Note  Patient Name: Amy Idalia Liskey Today's Date: 01/18/2014 Reason for consult: Follow-up assessment  Baby is 2119 hours old , and has been to the breast several times . [facility] latched in a modified  Laid back position, LC noted a consistent pattern with swallows, increased with breast compressions , and baby released after 5 mins. Nipple appeared pinched on one side. LC reviewed basics , breast massage , hand express, large drops of colostrum noted and assisted  Mom with re- latch in a cross cradle position , multiply swallows noted and increased with breast compressions. Per mom comfortable with latch. Baby fed for 20 mins. LC showed mom and dad Baby and me booklet and are aware of the pages for breast feeding. LC instructed mom on the use hand pump for D/C . Mother informed of post-discharge support and given phone number to the lactation department, including services for phone call assistance; out-patient appointments; and breastfeeding support group. List of other breastfeeding resources in the community given in the handout. Encouraged mother to call for problems or concerns related to breastfeeding.   Maternal Data Has patient been taught Hand Expression?: Yes  Feeding Feeding Type: Breast Fed Length of feed: 20 min  LATCH Score/Interventions Latch: Grasps breast easily, tongue down, lips flanged, rhythmical sucking.  Audible Swallowing: Spontaneous and intermittent  Type of Nipple: Everted at rest and after stimulation  Comfort (Breast/Nipple): Soft / non-tender     Hold (Positioning): Assistance needed to correctly position infant at breast and maintain latch. Intervention(s): Position options;Breastfeeding basics reviewed;Support Pillows;Skin to skin  LATCH Score: 9  Lactation Tools Discussed/Used WIC Program: No Pump Review: Setup, frequency, and  cleaning;Milk Storage Initiated by:: MAI  Date initiated:: 01/18/14   Consult Status Consult Status: Follow-up Date: 01/19/14 Follow-up type: In-patient    Kathrin Greathouseorio, Juwon Scripter Ann 01/18/2014, 2:33 PM

## 2014-01-18 NOTE — Plan of Care (Signed)
Problem: Discharge Progression Outcomes Goal: Tolerating diet Outcome: Completed/Met Date Met:  01/18/14

## 2014-01-18 NOTE — Progress Notes (Signed)
Post Partum Day 1 Subjective: no complaints, up ad lib, voiding and tolerating PO  Objective: Blood pressure 105/62, pulse 78, temperature 98.4 F (36.9 C), temperature source Oral, resp. rate 20, height 5' (1.524 m), weight 70.761 kg (156 lb), last menstrual period 04/13/2013, SpO2 98 %, unknown if currently breastfeeding.  Physical Exam:  General: alert, cooperative and no distress Lochia: appropriate Uterine Fundus: firm DVT Evaluation: No evidence of DVT seen on physical exam.   Recent Labs  01/17/14 1504  HGB 13.2  HCT 37.6    Assessment/Plan: Plan for discharge tomorrow and Breastfeeding   LOS: 1 day   Shirlee LatchBacigalupo, Tinesha Siegrist 01/18/2014, 7:45 AM

## 2014-01-18 NOTE — Plan of Care (Signed)
Problem: Consults Goal: Postpartum Patient Education (See Patient Education module for education specifics.)  Outcome: Completed/Met Date Met:  01/18/14 Goal: Skin Care Protocol Initiated - if Braden Score 18 or less If consults are not indicated, leave blank or document N/A  Outcome: Completed/Met Date Met:  01/18/14  Problem: Phase I Progression Outcomes Goal: Pain controlled with appropriate interventions Outcome: Completed/Met Date Met:  01/18/14 Goal: Voiding adequately Outcome: Completed/Met Date Met:  01/18/14 Goal: OOB as tolerated unless otherwise ordered Outcome: Completed/Met Date Met:  01/18/14 Goal: VS, stable, temp < 100.4 degrees F Outcome: Completed/Met Date Met:  01/18/14 Goal: Initial discharge plan identified Outcome: Completed/Met Date Met:  01/18/14 Goal: Other Phase I Outcomes/Goals Outcome: Completed/Met Date Met:  01/18/14

## 2014-01-18 NOTE — Plan of Care (Signed)
Problem: Phase II Progression Outcomes Goal: Pain controlled on oral analgesia Outcome: Completed/Met Date Met:  01/18/14 Goal: Progress activity as tolerated unless otherwise ordered Outcome: Completed/Met Date Met:  01/18/14 Goal: Afebrile, VS remain stable Outcome: Completed/Met Date Met:  01/18/14 Goal: Tolerating diet Outcome: Completed/Met Date Met:  01/18/14 Goal: Other Phase II Outcomes/Goals Outcome: Completed/Met Date Met:  01/18/14

## 2014-01-18 NOTE — Lactation Note (Signed)
This note was copied from the chart of Amy Franklin. Lactation Consultation Note Mom had all ready BF baby. Asked if she could call for next feeding if she needed anything and I could assess latch. Mom stated BF was going great, denies painful latches or difficulty. Mom encouraged to feed baby 8-12 times/24 hours and with feeding cues. Mom encouraged to waken baby for feeds. Reviewed Baby & Me book's Breastfeeding Basics. Encouraged to call for assistance if needed and to verify proper latch.WH/LC brochure given w/resources, support groups and LC services.Mom encouraged to do skin-to-skin. Patient Name: Amy Sanvika Pomeroy Today's Date: 01/18/2014 Reason for consult: Initial assessment   Maternal Data Has patient been taught Hand Expression?: Yes Does the patient have breastfeeding experience prior to this delivery?: No  Feeding Feeding Type: Breast Fed  LATCH Score/Interventions                      Lactation Tools Discussed/Used     Consult Status Consult Status: Follow-up Date: 01/18/14 Follow-up type: In-patient    Charyl DancerCARVER, [place]heton Viramontes G 01/18/2014, 3:54 AM

## 2014-01-18 NOTE — Plan of Care (Signed)
Problem: Discharge Progression Outcomes Goal: Pain controlled with appropriate interventions Outcome: Completed/Met Date Met:  01/18/14 Goal: Afebrile, VS remain stable at discharge Outcome: Completed/Met Date Met:  01/18/14

## 2014-01-19 ENCOUNTER — Ambulatory Visit: Payer: Self-pay

## 2014-01-19 MED ORDER — IBUPROFEN 600 MG PO TABS: 600.0000 mg | ORAL_TABLET | ORAL | Status: DC | PRN

## 2014-01-19 NOTE — Discharge Summary (Signed)
Obstetric Discharge Summary Reason for Admission: onset of labor Prenatal Procedures: none Intrapartum Procedures: spontaneous vaginal delivery Postpartum Procedures: none Complications-Operative and Postpartum: small midline 2nd degree and full thickness left labium minorum lacerations HEMOGLOBIN  Date Value Ref Range Status  01/17/2014 13.2 12.0 - 15.0 g/dL Final   HCT  Date Value Ref Range Status  01/17/2014 37.6 36.0 - 46.0 % Final    Hospital Course: Amy Franklin is a 23 y.o. G1P1001 who presented with labor.  She progressed well through labor and at 1951 on 11/18, delivered a baby girl via NSVD waterbirth.  No complications in post-partum period. On PPD#2, patient is doing well without complaints.  Physical Exam:  General: alert, cooperative and no distress Lochia: appropriate Uterine Fundus: firm DVT Evaluation: No evidence of DVT seen on physical exam.  Discharge Diagnoses: Term Pregnancy-delivered  Discharge Information: Date: 01/19/2014 Activity: pelvic rest Diet: routine Medications: Ibuprofen Condition: stable Instructions: refer to practice specific booklet Discharge to: home Follow-up Information    [facility] [place]. Schedule an appointment as soon as possible for a visit in 6 weeks.   Specialty:  Obstetrics and Gynecology   Why:  For post-partum visit   Contact information:   [address], [address] [place] [id number] [phone]      [facility].   Why:  As needed for emergencies   Contact information:   [address] [id number] mc [place] [id number] [phone]      Newborn Data: Live born female  Birth Weight: 7 lb 3.7 oz (3280 g) APGAR: 8, 9  Home with mother.  Shirlee LatchBacigalupo, Angela 01/19/2014, 7:45 AM   I have seen and examined this patient and agree the above  assessment. CRESENZO-DISHMAN,Cherrie Franca 01/23/2014 12:21 PM

## 2014-01-19 NOTE — Discharge Instructions (Signed)

## 2014-01-19 NOTE — Lactation Note (Signed)
This note was copied from the chart of Amy Franklin. Lactation Consultation Note  Patient Name: Amy Tiaria Pineau Today's Date: 01/19/2014 Reason for consult: Follow-up assessment (mom readly for discharge )  Per mom breast feeding has continued to go well. Per mom nipples are alitte Tender , LC instructed mom on the use of comfort gels. LC reviewed sore nipple and engorgement  Prevention and tx . Mom already has a hand pump. LC referred to the baby and me booklet. Mother informed of post-discharge support and given phone number to the lactation department, including services for phone  call assistance; out-patient appointments; and breastfeeding support group. List of other breastfeeding resources in the community  given in the handout. Encouraged mother to call for problems or concerns related to breastfeeding.   Maternal Data Has patient been taught Hand Expression?: Yes  Feeding Feeding Type: Breast Fed Length of feed: 60 min  LATCH Score/Interventions                Intervention(s): Breastfeeding basics reviewed     Lactation Tools Discussed/Used     Consult Status Consult Status: Complete Date: 01/19/14    Kathrin Greathouseorio, Erven Ramson Ann 01/19/2014, 12:50 PM

## 2014-01-22 ENCOUNTER — Encounter: Payer: BC Managed Care – PPO | Admitting: Advanced Practice Midwife

## 2014-01-24 NOTE — Progress Notes (Signed)
This is in regards to the Day #1 Progress Note written by Dr Beryle FlockBacigalupo. I have seen and examined this patient and I agree with the above. Cam HaiSHAW, Chancellor Vanderloop CNM 1:01 AM 01/24/2014

## 2014-02-26 ENCOUNTER — Ambulatory Visit (INDEPENDENT_AMBULATORY_CARE_PROVIDER_SITE_OTHER): Payer: BC Managed Care – PPO | Admitting: Advanced Practice Midwife

## 2014-02-26 ENCOUNTER — Encounter: Payer: Self-pay | Admitting: Advanced Practice Midwife

## 2014-02-26 NOTE — Progress Notes (Signed)
  Subjective:     Amy Franklin is a 23 y.o. female who presents for a postpartum visit. She is 6 weeks postpartum following a spontaneous vaginal delivery waterbirth. I have fully reviewed the prenatal and intrapartum course. Outcome: spontaneous vaginal delivery. Anesthesia: none. Postpartum course has been normal. Baby's course has been normal. Baby is feeding by breast. Bleeding no bleeding. Bowel function is mostly normal with some constipation. Bladder function is normal. Patient is not sexually active. Contraception method is none. Postpartum depression screening: negative.  The following portions of the patient's history were reviewed and updated as appropriate: allergies, current medications, past family history, past medical history, past social history, past surgical history and problem list.  Delivery Note on 01/17/14: At 7:51 PM a viable female was delivered via SVD. Presentation:OA with mother on hands/knees in birthing tub. APGAR: 8, 9; weight pending .  Placenta status: spontaneous, intact. Cord: 3V with the following complications :none.  Anesthesia: locatl 1% xylocaine Episiotomy: None Lacerations: small midline 2nd degree and full thickness left labium minorum Suture Repair: 3.0 vicryl rapide Est. Blood Loss (mL): 300cc  Mom to postpartum. Baby to Couplet care / Skin to Skin.  Review of Systems A comprehensive review of systems was negative.   Objective:    BP 128/84 mmHg  Pulse 96  Resp 16  Ht 5' (1.524 m)  Wt 142 lb (64.411 kg)  BMI 27.73 kg/m2  Breastfeeding? Yes  General:  alert, cooperative and no distress   Breasts:  negative  Lungs: Acyanotic, no signs of respiratory distress  Heart:    Abdomen: soft, non-tender; bowel sounds normal; no masses,  no organomegaly   Vulva:  normal   Vagina: normal vagina, no discharge, exudate, lesion, or erythema  Cervix:  not evaluated  Corpus: not examined  Adnexa:  not evaluated  Rectal Exam: Not performed.      Assessment:     Normal postpartum exam.    Plan:    1. Contraception: none. Discussed options with pt, pt and husband using lactational amenorrhea for now and desire pregnancy in the near future so do not want other forms of contraception. 2.  Recommend Colace BID prn for constipation and discussed high fiber diet with pt. 3. Follow up in: 1 year or as needed.

## 2014-02-26 NOTE — Patient Instructions (Signed)
Recommend Colace (docusate sodium) 1-2 times daily as needed for constipation.  High-Fiber Diet Fiber is found in fruits, vegetables, and grains. A high-fiber diet encourages the addition of more whole grains, legumes, fruits, and vegetables in your diet. The recommended amount of fiber for adult males is 38 g per day. For adult females, it is 25 g per day. Pregnant and lactating women should get 28 g of fiber per day. If you have a digestive or bowel problem, ask your caregiver for advice before adding high-fiber foods to your diet. Eat a variety of high-fiber foods instead of only a select few type of foods.  PURPOSE  To increase stool bulk.  To make bowel movements more regular to prevent constipation.  To lower cholesterol.  To prevent overeating. WHEN IS THIS DIET USED?  It may be used if you have constipation and hemorrhoids.  It may be used if you have uncomplicated diverticulosis (intestine condition) and irritable bowel syndrome.  It may be used if you need help with weight management.  It may be used if you want to add it to your diet as a protective measure against atherosclerosis, diabetes, and cancer. SOURCES OF FIBER  Whole-grain breads and cereals.  Fruits, such as apples, oranges, bananas, berries, prunes, and pears.  Vegetables, such as green peas, carrots, sweet potatoes, beets, broccoli, cabbage, spinach, and artichokes.  Legumes, such split peas, soy, lentils.  Almonds. FIBER CONTENT IN FOODS Starches and Grains / Dietary Fiber (g)  Raisin Bran, 1 cup/ 7g  Cheerios, 1 cup / 3 g  Corn Flakes cereal, 1 cup / 0.7 g  Rice crispy treat cereal, 1 cup / 0.3 g  Instant oatmeal (cooked),  cup / 2 g  Frosted wheat cereal, 1 cup / 5.1 g  Brown, long-grain rice (cooked), 1 cup / 3.5 g  White, long-grain rice (cooked), 1 cup / 0.6 g  Enriched macaroni (cooked), 1 cup / 2.5 g Legumes / Dietary Fiber (g)  Baked beans (canned, plain, or vegetarian),  cup  / 5.2 g  Kidney beans (canned),  cup / 6.8 g  Pinto beans (cooked),  cup / 5.5 g Breads and Crackers / Dietary Fiber (g)  Plain or honey graham crackers, 2 squares / 0.7 g  Saltine crackers, 3 squares / 0.3 g  Plain, salted pretzels, 10 pieces / 1.8 g  Whole-wheat bread, 1 slice / 1.9 g  White bread, 1 slice / 0.7 g  Raisin bread, 1 slice / 1.2 g  Plain bagel, 3 oz / 2 g  Flour tortilla, 1 oz / 0.9 g  Corn tortilla, 1 small / 1.5 g  Hamburger or hotdog bun, 1 small / 0.9 g Fruits / Dietary Fiber (g)  Apple with skin, 1 medium / 4.4 g  Sweetened applesauce,  cup / 1.5 g  Banana,  medium / 1.5 g  Grapes, 10 grapes / 0.4 g  Orange, 1 small / 2.3 g  Raisin, 1.5 oz / 1.6 g  Melon, 1 cup / 1.4 g Vegetables / Dietary Fiber (g)  Green beans (canned),  cup / 1.3 g  Carrots (cooked),  cup / 2.3 g  Broccoli (cooked),  cup / 2.8 g  Peas (cooked),  cup / 4.4 g  Mashed potatoes,  cup / 1.6 g  Lettuce, 1 cup / 0.5 g  Corn (canned),  cup / 1.6 g  Tomato,  cup / 1.1 g Document Released: 02/16/2005 Document Revised: 08/18/2011 Document Reviewed: 05/21/2011 ExitCare Patient Information 2015  [employer]. This information is not intended to replace advice given to you by your health care provider. Make sure you discuss any questions you have with your health care provider.

## 2015-10-07 ENCOUNTER — Other Ambulatory Visit: Payer: Self-pay | Admitting: Family Medicine

## 2015-10-07 ENCOUNTER — Other Ambulatory Visit (HOSPITAL_COMMUNITY)
Admission: RE | Admit: 2015-10-07 | Discharge: 2015-10-07 | Disposition: A | Discharge status: Home / Self Care | Payer: BLUE CROSS/BLUE SHIELD | Source: Ambulatory Visit | Attending: Family Medicine | Admitting: Family Medicine

## 2015-10-07 DIAGNOSIS — Z01419 Encounter for gynecological examination (general) (routine) without abnormal findings: Secondary | ICD-10-CM | POA: Diagnosis present

## 2015-10-09 LAB — CYTOLOGY - PAP

## 2016-03-02 NOTE — L&D Delivery Note (Signed)
Pt progressed rapidly to C/0  Pt in birth tub and active pushing started @0305   Water temp just prior to delivery 97.4  Delivery Note At 4:23 AM a viable female was delivered under water in tub via Vaginal, Spontaneous Delivery (Presentation: OA).  APGAR: 8, 9; weight pending. Placenta status: delivered, intact.  Cord: 3V with the following complications: none. Cord pH: n/a  Anesthesia: None   Episiotomy: None Lacerations: Left Labial, small, hemostatic Est. Blood Loss (mL): 25  Mom to postpartum.  Baby to Couplet care / Skin to Skin.  Donette LarryMelanie Yadriel Kerrigan, CNM 11/12/2016, 4:45 AM

## 2016-04-10 ENCOUNTER — Ambulatory Visit (INDEPENDENT_AMBULATORY_CARE_PROVIDER_SITE_OTHER): Payer: BLUE CROSS/BLUE SHIELD | Admitting: Advanced Practice Midwife

## 2016-04-10 ENCOUNTER — Encounter: Payer: Self-pay | Admitting: Advanced Practice Midwife

## 2016-04-10 VITALS — BP 107/72 | HR 74 | Wt 138.0 lb

## 2016-04-10 DIAGNOSIS — Z3481 Encounter for supervision of other normal pregnancy, first trimester: Secondary | ICD-10-CM | POA: Diagnosis not present

## 2016-04-10 DIAGNOSIS — Z3689 Encounter for other specified antenatal screening: Secondary | ICD-10-CM | POA: Diagnosis not present

## 2016-04-10 DIAGNOSIS — Z3490 Encounter for supervision of normal pregnancy, unspecified, unspecified trimester: Secondary | ICD-10-CM | POA: Insufficient documentation

## 2016-04-10 DIAGNOSIS — Z23 Encounter for immunization: Secondary | ICD-10-CM | POA: Diagnosis not present

## 2016-04-10 DIAGNOSIS — Z113 Encounter for screening for infections with a predominantly sexual mode of transmission: Secondary | ICD-10-CM

## 2016-04-10 DIAGNOSIS — Z3491 Encounter for supervision of normal pregnancy, unspecified, first trimester: Secondary | ICD-10-CM

## 2016-04-10 LAB — GLUCOSE, RANDOM: GLUCOSE: 84 mg/dL (ref 65–99)

## 2016-04-10 NOTE — Patient Instructions (Signed)
First Trimester of Pregnancy  The first trimester of pregnancy is from week 1 until the end of week 12 (months 1 through 3). A week after a sperm fertilizes an egg, the egg will implant on the wall of the uterus. This embryo will begin to develop into a baby. Genes from you and your partner are forming the baby. The female genes determine whether the baby is a boy or a girl. At 6-8 weeks, the eyes and face are formed, and the heartbeat can be seen on ultrasound. At the end of 12 weeks, all the baby's organs are formed.   Now that you are pregnant, you will want to do everything you can to have a healthy baby. Two of the most important things are to get good prenatal care and to follow your health care provider's instructions. Prenatal care is all the medical care you receive before the baby's birth. This care will help prevent, find, and treat any problems during the pregnancy and childbirth.  BODY CHANGES  Your body goes through many changes during pregnancy. The changes vary from woman to woman.   · You may gain or lose a couple of pounds at first.  · You may feel sick to your stomach (nauseous) and throw up (vomit). If the vomiting is uncontrollable, call your health care provider.  · You may tire easily.  · You may develop headaches that can be relieved by medicines approved by your health care provider.  · You may urinate more often. Painful urination may mean you have a bladder infection.  · You may develop heartburn as a result of your pregnancy.  · You may develop constipation because certain hormones are causing the muscles that push waste through your intestines to slow down.  · You may develop hemorrhoids or swollen, bulging veins (varicose veins).  · Your breasts may begin to grow larger and become tender. Your nipples may stick out more, and the tissue that surrounds them (areola) may become darker.  · Your gums may bleed and may be sensitive to brushing and flossing.   · Dark spots or blotches (chloasma, mask of pregnancy) may develop on your face. This will likely fade after the baby is born.  · Your menstrual periods will stop.  · You may have a loss of appetite.  · You may develop cravings for certain kinds of food.  · You may have changes in your emotions from day to day, such as being excited to be pregnant or being concerned that something may go wrong with the pregnancy and baby.  · You may have more vivid and strange dreams.  · You may have changes in your hair. These can include thickening of your hair, rapid growth, and changes in texture. Some women also have hair loss during or after pregnancy, or hair that feels dry or thin. Your hair will most likely return to normal after your baby is born.  WHAT TO EXPECT AT YOUR PRENATAL VISITS  During a routine prenatal visit:  · You will be weighed to make sure you and the baby are growing normally.  · Your blood pressure will be taken.  · Your abdomen will be measured to track your baby's growth.  · The fetal heartbeat will be listened to starting around week 10 or 12 of your pregnancy.  · Test results from any previous visits will be discussed.  Your health care provider may ask you:  · How you are feeling.  · If you   are feeling the baby move.  · If you have had any abnormal symptoms, such as leaking fluid, bleeding, severe headaches, or abdominal cramping.  · If you are using any tobacco products, including cigarettes, chewing tobacco, and electronic cigarettes.  · If you have any questions.  Other tests that may be performed during your first trimester include:  · Blood tests to find your blood type and to check for the presence of any previous infections. They will also be used to check for low iron levels (anemia) and Rh antibodies. Later in the pregnancy, blood tests for diabetes will be done along with other tests if problems develop.  · Urine tests to check for infections, diabetes, or protein in the urine.   · An ultrasound to confirm the proper growth and development of the baby.  · An amniocentesis to check for possible genetic problems.  · Fetal screens for spina bifida and Down syndrome.  · You may need other tests to make sure you and the baby are doing well.  · HIV (human immunodeficiency virus) testing. Routine prenatal testing includes screening for HIV, unless you choose not to have this test.  HOME CARE INSTRUCTIONS   Medicines  · Follow your health care provider's instructions regarding medicine use. Specific medicines may be either safe or unsafe to take during pregnancy.  · Take your prenatal vitamins as directed.  · If you develop constipation, try taking a stool softener if your health care provider approves.  Diet  · Eat regular, well-balanced meals. Choose a variety of foods, such as meat or vegetable-based protein, fish, milk and low-fat dairy products, vegetables, fruits, and whole grain breads and cereals. Your health care provider will help you determine the amount of weight gain that is right for you.  · Avoid raw meat and uncooked cheese. These carry germs that can cause birth defects in the baby.  · Eating four or five small meals rather than three large meals a day may help relieve nausea and vomiting. If you start to feel nauseous, eating a few soda crackers can be helpful. Drinking liquids between meals instead of during meals also seems to help nausea and vomiting.  · If you develop constipation, eat more high-fiber foods, such as fresh vegetables or fruit and whole grains. Drink enough fluids to keep your urine clear or pale yellow.  Activity and Exercise  · Exercise only as directed by your health care provider. Exercising will help you:    Control your weight.    Stay in shape.    Be prepared for labor and delivery.  · Experiencing pain or cramping in the lower abdomen or low back is a good sign that you should stop exercising. Check with your health care provider  before continuing normal exercises.  · Try to avoid standing for long periods of time. Move your legs often if you must stand in one place for a long time.  · Avoid heavy lifting.  · Wear low-heeled shoes, and practice good posture.  · You may continue to have sex unless your health care provider directs you otherwise.  Relief of Pain or Discomfort  · Wear a good support bra for breast tenderness.    · Take warm sitz baths to soothe any pain or discomfort caused by hemorrhoids. Use hemorrhoid cream if your health care provider approves.    · Rest with your legs elevated if you have leg cramps or low back pain.  · If you develop varicose veins in your   legs, wear support hose. Elevate your feet for 15 minutes, 3-4 times a day. Limit salt in your diet.  Prenatal Care  · Schedule your prenatal visits by the twelfth week of pregnancy. They are usually scheduled monthly at first, then more often in the last 2 months before delivery.  · Write down your questions. Take them to your prenatal visits.  · Keep all your prenatal visits as directed by your health care provider.  Safety  · Wear your seat belt at all times when driving.  · Make a list of emergency phone numbers, including numbers for family, friends, the hospital, and police and fire departments.  General Tips  · Ask your health care provider for a referral to a local prenatal education class. Begin classes no later than at the beginning of month 6 of your pregnancy.  · Ask for help if you have counseling or nutritional needs during pregnancy. Your health care provider can offer advice or refer you to specialists for help with various needs.  · Do not use hot tubs, steam rooms, or saunas.  · Do not douche or use tampons or scented sanitary pads.  · Do not cross your legs for long periods of time.  · Avoid cat litter boxes and soil used by cats. These carry germs that can cause birth defects in the baby and possibly loss of the fetus by miscarriage or stillbirth.   · Avoid all smoking, herbs, alcohol, and medicines not prescribed by your health care provider. Chemicals in these affect the formation and growth of the baby.  · Do not use any tobacco products, including cigarettes, chewing tobacco, and electronic cigarettes. If you need help quitting, ask your health care provider. You may receive counseling support and other resources to help you quit.  · Schedule a dentist appointment. At home, brush your teeth with a soft toothbrush and be gentle when you floss.  SEEK MEDICAL CARE IF:   · You have dizziness.  · You have mild pelvic cramps, pelvic pressure, or nagging pain in the abdominal area.  · You have persistent nausea, vomiting, or diarrhea.  · You have a bad smelling vaginal discharge.  · You have pain with urination.  · You notice increased swelling in your face, hands, legs, or ankles.  SEEK IMMEDIATE MEDICAL CARE IF:   · You have a fever.  · You are leaking fluid from your vagina.  · You have spotting or bleeding from your vagina.  · You have severe abdominal cramping or pain.  · You have rapid weight gain or loss.  · You vomit blood or material that looks like coffee grounds.  · You are exposed to German measles and have never had them.  · You are exposed to fifth disease or chickenpox.  · You develop a severe headache.  · You have shortness of breath.  · You have any kind of trauma, such as from a fall or a car accident.     This information is not intended to replace advice given to you by your health care provider. Make sure you discuss any questions you have with your health care provider.     Document Released: 02/10/2001 Document Revised: 03/09/2014 Document Reviewed: 12/27/2012  Elsevier Interactive Patient Education ©2017 [employer]

## 2016-04-10 NOTE — Progress Notes (Signed)
[facility] 2017 [facility]

## 2016-04-10 NOTE — Progress Notes (Signed)
Bedside U/S shows IUP with FHT of 177 BPM and CRL is 19.592mm  GA is 8898w3d

## 2016-04-10 NOTE — Progress Notes (Signed)
  Subjective:    Amy Franklin is being seen today for her first obstetrical visit.  This is a planned pregnancy. She is at 7196w4d gestation by LMP verified by BS US today. Her obstetrical history is significant for nothing. Relationship with FOB: spouse, living together. Patient does intend to breast feed. Pregnancy history fully reviewed.  Patient reports no complaints.  Review of Systems:   Review of Systems  Constitutional: Negative for chills and fever.  Gastrointestinal: Positive for constipation. Negative for abdominal pain, nausea and vomiting.  Genitourinary: Negative for dysuria, vaginal bleeding and vaginal discharge.    Objective:     BP 107/72   Pulse 74   Wt 138 lb (62.6 kg)   LMP 02/10/2016   BMI 26.95 kg/m  Physical Exam  Nursing note and vitals reviewed. Constitutional: She is oriented to person, place, and time. She appears well-developed and well-nourished. No distress.  Eyes: No scleral icterus.  Neck: No thyromegaly present.  Cardiovascular: Normal rate and normal heart sounds.  Exam reveals no gallop and no friction rub.   No murmur heard. One skipped beat  Respiratory: Effort normal and breath sounds normal. No respiratory distress.  GI: Soft. Bowel sounds are normal. She exhibits no distension. There is no tenderness.  Musculoskeletal: She exhibits no edema or tenderness.  Neurological: She is alert and oriented to person, place, and time. She has normal reflexes.  Skin: Skin is warm and dry.  Psychiatric: She has a normal mood and affect.    Exam  Pelvic exam deferred due to recent exam.     Assessment:    Pregnancy: G2P1001 Patient Active Problem List   Diagnosis Date Noted  . Supervision of normal pregnancy 04/10/2016  . NSVD (normal spontaneous vaginal delivery) 01/17/2014       Plan:     Initial labs drawn. Prenatal vitamins. Problem list reviewed and updated. AFP3 discussed: declined. Role of ultrasound in pregnancy discussed;  fetal survey: requested. Amniocentesis discussed: not indicated. Follow up in 4 weeks. Plans repeat waterbirth. Has already taken class and has tub.  [facility]   [employer]ia Sahand Franklin 04/10/2016

## 2016-04-11 LAB — HEMOGLOBIN A1C
HEMOGLOBIN A1C: 4.6 % (ref <5.7)
Mean Plasma Glucose: 85 mg/dL

## 2016-04-11 LAB — CULTURE, OB URINE

## 2016-04-12 LAB — PAIN MGMT, PROFILE 6 CONF W/O MM, U
6 Acetylmorphine: NEGATIVE ng/mL (ref <10)
Alcohol Metabolites: NEGATIVE ng/mL (ref <500)
Amphetamines: NEGATIVE ng/mL (ref <500)
Barbiturates: NEGATIVE ng/mL (ref <300)
Benzodiazepines: NEGATIVE ng/mL (ref <100)
COCAINE METABOLITE: NEGATIVE ng/mL (ref <150)
Creatinine: 161.9 mg/dL (ref >=20.0)
Marijuana Metabolite: NEGATIVE ng/mL (ref <20)
Methadone Metabolite: NEGATIVE ng/mL (ref <100)
OXYCODONE: NEGATIVE ng/mL (ref <100)
Opiates: NEGATIVE ng/mL (ref <100)
Oxidant: NEGATIVE ug/mL (ref <200)
Phencyclidine: NEGATIVE ng/mL (ref <25)
Please note:: 0
pH: 7.16 (ref 4.5–9.0)

## 2016-04-13 LAB — PRENATAL PROFILE (SOLSTAS)
Antibody Screen: NEGATIVE
BASOS ABS: 0 {cells}/uL (ref 0–200)
Basophils Relative: 0 %
EOS ABS: 61 {cells}/uL (ref 15–500)
Eosinophils Relative: 1 %
HCT: 38.4 % (ref 35.0–45.0)
HEP B S AG: NEGATIVE
HIV: NONREACTIVE
Hemoglobin: 13.4 g/dL (ref 11.7–15.5)
Lymphocytes Relative: 25 %
Lymphs Abs: 1525 cells/uL (ref 850–3900)
MCH: 31.5 pg (ref 27.0–33.0)
MCHC: 34.9 g/dL (ref 32.0–36.0)
MCV: 90.1 fL (ref 80.0–100.0)
MPV: 11.4 fL (ref 7.5–12.5)
Monocytes Absolute: 549 cells/uL (ref 200–950)
Monocytes Relative: 9 %
NEUTROS PCT: 65 %
Neutro Abs: 3965 cells/uL (ref 1500–7800)
Platelets: 249 10*3/uL (ref 140–400)
RBC: 4.26 MIL/uL (ref 3.80–5.10)
RDW: 14.1 % (ref 11.0–15.0)
RUBELLA: 6.43 {index} — AB (ref <0.90)
Rh Type: POSITIVE
WBC: 6.1 10*3/uL (ref 3.8–10.8)

## 2016-04-13 LAB — URINE CYTOLOGY ANCILLARY ONLY
Chlamydia: NEGATIVE
Neisseria Gonorrhea: NEGATIVE

## 2016-05-08 ENCOUNTER — Ambulatory Visit (INDEPENDENT_AMBULATORY_CARE_PROVIDER_SITE_OTHER): Payer: BLUE CROSS/BLUE SHIELD | Admitting: Advanced Practice Midwife

## 2016-05-08 VITALS — BP 114/73 | HR 78 | Wt 142.0 lb

## 2016-05-08 DIAGNOSIS — Z363 Encounter for antenatal screening for malformations: Secondary | ICD-10-CM

## 2016-05-08 DIAGNOSIS — Z3482 Encounter for supervision of other normal pregnancy, second trimester: Secondary | ICD-10-CM

## 2016-05-08 NOTE — Patient Instructions (Signed)

## 2016-05-08 NOTE — Progress Notes (Signed)
   PRENATAL VISIT NOTE  Subjective:  Amy Franklin is a 26 y.o. G2P1001 at 818w4d being seen today for ongoing prenatal care.  She is currently monitored for the following issues for this low-risk pregnancy and has Supervision of normal pregnancy on her problem list.  Patient reports round ligament pain.   . Vag. Bleeding: None.  Movement: Absent. Denies leaking of fluid.   The following portions of the patient's history were reviewed and updated as appropriate: allergies, current medications, past family history, past medical history, past social history, past surgical history and problem list. Problem list updated.  Objective:   Vitals:   05/08/16 0925  BP: 114/73  Pulse: 78  Weight: 142 lb (64.4 kg)    Fetal Status: Fetal Heart Rate (bpm): 157 Fundal Height: 13 cm Movement: Absent     General:  Alert, oriented and cooperative. Patient is in no acute distress.  Skin: Skin is warm and dry. No rash noted.   Cardiovascular: Normal heart rate noted  Respiratory: Normal respiratory effort, no problems with respiration noted  Abdomen: Soft, gravid, appropriate for gestational age. Pain/Pressure: Absent     Pelvic:  Cervical exam deferred        Extremities: Normal range of motion.  Edema: None  Mental Status: Normal mood and affect. Normal behavior. Normal judgment and thought content.   Assessment and Plan:  Pregnancy: G2P1001 at 6118w4d  1. Antenatal screening for malformation using ultrasonics  - US MFM OB COMP + 14 WK; Future  2. Encounter for supervision of other normal pregnancy in second trimester   Preterm labor symptoms and general obstetric precautions including but not limited to vaginal bleeding, contractions, leaking of fluid and fetal movement were reviewed in detail with the patient. Please refer to After Visit Summary for other counseling recommendations.  Declined genetic screening Return in about 8 weeks (around 07/03/2016).   Dorathy KinsmanVirginia Olaoluwa Franklin, CNM

## 2016-06-19 ENCOUNTER — Other Ambulatory Visit: Payer: Self-pay | Admitting: Advanced Practice Midwife

## 2016-06-19 ENCOUNTER — Ambulatory Visit (HOSPITAL_COMMUNITY)
Admission: RE | Admit: 2016-06-19 | Discharge: 2016-06-19 | Disposition: A | Discharge status: Home / Self Care | Payer: BLUE CROSS/BLUE SHIELD | Source: Ambulatory Visit | Attending: Advanced Practice Midwife | Admitting: Advanced Practice Midwife

## 2016-06-19 DIAGNOSIS — Z3A19 19 weeks gestation of pregnancy: Secondary | ICD-10-CM

## 2016-06-19 DIAGNOSIS — Z3689 Encounter for other specified antenatal screening: Secondary | ICD-10-CM | POA: Insufficient documentation

## 2016-06-19 DIAGNOSIS — Z3A18 18 weeks gestation of pregnancy: Secondary | ICD-10-CM | POA: Diagnosis not present

## 2016-06-19 DIAGNOSIS — Z363 Encounter for antenatal screening for malformations: Secondary | ICD-10-CM

## 2016-07-03 ENCOUNTER — Ambulatory Visit (INDEPENDENT_AMBULATORY_CARE_PROVIDER_SITE_OTHER): Payer: BLUE CROSS/BLUE SHIELD | Admitting: Advanced Practice Midwife

## 2016-07-03 VITALS — BP 106/61 | HR 67 | Wt 152.0 lb

## 2016-07-03 DIAGNOSIS — Z3482 Encounter for supervision of other normal pregnancy, second trimester: Secondary | ICD-10-CM

## 2016-07-03 DIAGNOSIS — Z3480 Encounter for supervision of other normal pregnancy, unspecified trimester: Secondary | ICD-10-CM

## 2016-07-03 NOTE — Progress Notes (Signed)
   PRENATAL VISIT NOTE  Subjective:  Amy Franklin is a 26 y.o. G2P1001 at 2656w4d being seen today for ongoing prenatal care.  She is currently monitored for the following issues for this low-risk pregnancy and has Supervision of normal pregnancy on her problem list.  Patient reports no complaints.   . Vag. Bleeding: None.  Movement: Present. Denies leaking of fluid.   The following portions of the patient's history were reviewed and updated as appropriate: allergies, current medications, past family history, past medical history, past social history, past surgical history and problem list. Problem list updated.  Objective:   Vitals:   07/03/16 0819  BP: 106/61  Pulse: 67  Weight: 152 lb (68.9 kg)    Fetal Status: Fetal Heart Rate (bpm): 147   Movement: Present     General:  Alert, oriented and cooperative. Patient is in no acute distress.  Skin: Skin is warm and dry. No rash noted.   Cardiovascular: Normal heart rate noted  Respiratory: Normal respiratory effort, no problems with respiration noted  Abdomen: Soft, gravid, appropriate for gestational age. Pain/Pressure: Present     Pelvic:  Cervical exam deferred        Extremities: Normal range of motion.  Edema: None  Mental Status: Normal mood and affect. Normal behavior. Normal judgment and thought content.   Assessment and Plan:  Pregnancy: G2P1001 at 3156w4d  1. Supervision of other normal pregnancy, antepartum     Schedule followup - US MFM OB FOLLOW UP; Future       May want to do BTL   Discussed procedure and permanency  Preterm labor symptoms and general obstetric precautions including but not limited to vaginal bleeding, contractions, leaking of fluid and fetal movement were reviewed in detail with the patient. Please refer to After Visit Summary for other counseling recommendations.  Return in 4 weeks (on 07/31/2016[place] [facility].   Aviva SignsMarie L Williams, CNM

## 2016-07-03 NOTE — Progress Notes (Signed)
Routine

## 2016-07-03 NOTE — Patient Instructions (Signed)

## 2016-07-23 ENCOUNTER — Other Ambulatory Visit: Payer: Self-pay | Admitting: Advanced Practice Midwife

## 2016-07-23 ENCOUNTER — Ambulatory Visit (HOSPITAL_COMMUNITY)
Admission: RE | Admit: 2016-07-23 | Discharge: 2016-07-23 | Disposition: A | Discharge status: Home / Self Care | Payer: BLUE CROSS/BLUE SHIELD | Source: Ambulatory Visit | Attending: Advanced Practice Midwife | Admitting: Advanced Practice Midwife

## 2016-07-23 DIAGNOSIS — Z3480 Encounter for supervision of other normal pregnancy, unspecified trimester: Secondary | ICD-10-CM | POA: Diagnosis present

## 2016-07-23 DIAGNOSIS — Z3A23 23 weeks gestation of pregnancy: Secondary | ICD-10-CM

## 2016-07-23 DIAGNOSIS — Z3482 Encounter for supervision of other normal pregnancy, second trimester: Secondary | ICD-10-CM | POA: Insufficient documentation

## 2016-07-23 DIAGNOSIS — Z362 Encounter for other antenatal screening follow-up: Secondary | ICD-10-CM

## 2016-07-24 ENCOUNTER — Ambulatory Visit (HOSPITAL_COMMUNITY): Payer: BLUE CROSS/BLUE SHIELD

## 2016-07-30 ENCOUNTER — Ambulatory Visit (INDEPENDENT_AMBULATORY_CARE_PROVIDER_SITE_OTHER): Payer: BLUE CROSS/BLUE SHIELD | Admitting: Obstetrics & Gynecology

## 2016-07-30 DIAGNOSIS — Z3482 Encounter for supervision of other normal pregnancy, second trimester: Secondary | ICD-10-CM

## 2016-07-30 NOTE — Progress Notes (Signed)
   PRENATAL VISIT NOTE  Subjective:  Kuulei Birdena CrandallRainey is a 26 y.o. G2P1001 at 8017w3d being seen today for ongoing prenatal care.  She is currently monitored for the following issues for this low-risk pregnancy and has Supervision of normal pregnancy on her problem list.  Patient reports no complaints.  Contractions: Not present. Vag. Bleeding: None.  Movement: Present. Denies leaking of fluid.   The following portions of the patient's history were reviewed and updated as appropriate: allergies, current medications, past family history, past medical history, past social history, past surgical history and problem list. Problem list updated.  Objective:   Vitals:   07/30/16 1323  BP: 112/68  Pulse: 91  Weight: 158 lb (71.7 kg)    Fetal Status: Fetal Heart Rate (bpm): 139   Movement: Present     General:  Alert, oriented and cooperative. Patient is in no acute distress.  Skin: Skin is warm and dry. No rash noted.   Cardiovascular: Normal heart rate noted  Respiratory: Normal respiratory effort, no problems with respiration noted  Abdomen: Soft, gravid, appropriate for gestational age. Pain/Pressure: Absent     Pelvic:  Cervical exam deferred        Extremities: Normal range of motion.  Edema: None  Mental Status: Normal mood and affect. Normal behavior. Normal judgment and thought content.   Assessment and Plan:  Pregnancy: G2P1001 at 7017w3d  1. Encounter for supervision of other normal pregnancy in second trimester -Babyscripts compliant -Pt was scheduled to come back at 24 weeks instead of 28 weeks.  Discussed about 6 week interval this next time.  Pt has been very compliant with babyscripts.  Will do GTT and tdap at that next visit.  Preterm labor symptoms and general obstetric precautions including but not limited to vaginal bleeding, contractions, leaking of fluid and fetal movement were reviewed in detail with the patient. Please refer to After Visit Summary for other counseling  recommendations.  Return in about 6 weeks (around 09/10/2016).   Elsie LincolnKelly Salah Burlison, MD

## 2016-08-19 ENCOUNTER — Encounter: Payer: Self-pay | Admitting: Obstetrics & Gynecology

## 2016-09-11 ENCOUNTER — Encounter: Payer: Self-pay | Admitting: Advanced Practice Midwife

## 2016-09-11 ENCOUNTER — Ambulatory Visit (INDEPENDENT_AMBULATORY_CARE_PROVIDER_SITE_OTHER): Payer: BLUE CROSS/BLUE SHIELD | Admitting: Advanced Practice Midwife

## 2016-09-11 VITALS — BP 108/72 | HR 88 | Wt 166.0 lb

## 2016-09-11 DIAGNOSIS — Z3483 Encounter for supervision of other normal pregnancy, third trimester: Secondary | ICD-10-CM

## 2016-09-11 DIAGNOSIS — R51 Headache: Secondary | ICD-10-CM

## 2016-09-11 DIAGNOSIS — O26893 Other specified pregnancy related conditions, third trimester: Secondary | ICD-10-CM

## 2016-09-11 DIAGNOSIS — Z23 Encounter for immunization: Secondary | ICD-10-CM | POA: Diagnosis not present

## 2016-09-11 DIAGNOSIS — Z3493 Encounter for supervision of normal pregnancy, unspecified, third trimester: Secondary | ICD-10-CM

## 2016-09-11 DIAGNOSIS — Z789 Other specified health status: Secondary | ICD-10-CM

## 2016-09-11 LAB — CBC
HCT: 36.8 % (ref 35.0–45.0)
Hemoglobin: 12.8 g/dL (ref 11.7–15.5)
MCH: 31.7 pg (ref 27.0–33.0)
MCHC: 34.8 g/dL (ref 32.0–36.0)
MCV: 91.1 fL (ref 80.0–100.0)
MPV: 11.7 fL (ref 7.5–12.5)
Platelets: 208 10*3/uL (ref 140–400)
RBC: 4.04 MIL/uL (ref 3.80–5.10)
RDW: 13.2 % (ref 11.0–15.0)
WBC: 11.2 10*3/uL — ABNORMAL HIGH (ref 3.8–10.8)

## 2016-09-11 MED ORDER — BREAST PUMP MISC: 0 refills | Status: DC

## 2016-09-11 NOTE — Progress Notes (Signed)
   PRENATAL VISIT NOTE  Subjective:  Amy Franklin is a 26 y.o. G2P1001 at 1365w4d being seen today for ongoing prenatal care.  She is currently monitored for the following issues for this low-risk pregnancy and has Supervision of normal pregnancy on her problem list.  Patient reports headache. Not resolving w/ Tylenol, but then goes away on its own. No fever, chills, vision changes, epigastric pain or high blood pressure. Thinks it may be related to poor sleep  Contractions: Not present.  .  Movement: Present. Denies leaking of fluid.   The following portions of the patient's history were reviewed and updated as appropriate: allergies, current medications, past family history, past medical history, past social history, past surgical history and problem list. Problem list updated.  Objective:   Vitals:   09/11/16 0819  BP: 108/72  Pulse: 88  Weight: 166 lb (75.3 kg)    Fetal Status: Fetal Heart Rate (bpm): 140 Fundal Height: 30 cm Movement: Present  Presentation: Vertex  General:  Alert, oriented and cooperative. Patient is in no acute distress.  Skin: Skin is warm and dry. No rash noted.   Cardiovascular: Normal heart rate noted  Respiratory: Normal respiratory effort, no problems with respiration noted  Abdomen: Soft, gravid, appropriate for gestational age. Pain/Pressure: Absent     Pelvic:  Cervical exam deferred        Extremities: Normal range of motion.  Edema: None  Mental Status: Normal mood and affect. Normal behavior. Normal judgment and thought content.   Assessment and Plan:  Pregnancy: G2P1001 at 6765w4d  1. Encounter for supervision of normal pregnancy in third trimester, unspecified gravidity  - 2Hr GTT w/ 1 Hr Carpenter 75 g - CBC - HIV antibody (with reflex) - RPR - Tdap vaccine greater than or equal to 7yo IM  2. Exclusively breastfeed infant  - Misc. Devices (BREAST PUMP) MISC; Dispense one breast pump for patient  Dispense: 1 each; Refill: 0  3. Headache  in pregnancy--No evidence of pre-E. No emergent HA - Comfort measures, push fluids, caffeine PRN, massage - HA red flags reviewed - Pre-E precautions.  - Declined Pain meds.  Preterm labor symptoms and general obstetric precautions including but not limited to vaginal bleeding, contractions, leaking of fluid and fetal movement were reviewed in detail with the patient. Please refer to After Visit Summary for other counseling recommendations.  Return in about 2 weeks (around 09/25/2016) for [facility].   Dorathy KinsmanVirginia Karessa Onorato, CNM

## 2016-09-11 NOTE — Patient Instructions (Signed)

## 2016-09-12 LAB — HIV ANTIBODY (ROUTINE TESTING W REFLEX): HIV: NONREACTIVE

## 2016-09-12 LAB — 2HR GTT W 1 HR, CARPENTER, 75 G
GLUCOSE, 2 HR, GEST: 126 mg/dL (ref <153)
GLUCOSE, FASTING, GEST: 73 mg/dL (ref 65–91)
Glucose, 1 Hr, Gest: 135 mg/dL (ref <180)

## 2016-09-16 LAB — RPR

## 2016-09-17 ENCOUNTER — Ambulatory Visit (INDEPENDENT_AMBULATORY_CARE_PROVIDER_SITE_OTHER): Payer: BLUE CROSS/BLUE SHIELD | Admitting: Obstetrics & Gynecology

## 2016-09-17 VITALS — BP 108/68 | HR 93 | Wt 168.0 lb

## 2016-09-17 DIAGNOSIS — Z3482 Encounter for supervision of other normal pregnancy, second trimester: Secondary | ICD-10-CM

## 2016-09-17 NOTE — Patient Instructions (Addendum)
Third Trimester of Pregnancy The third trimester is from week 28 through week 40 (months 7 through 9). The third trimester is a time when the unborn baby (fetus) is growing rapidly. At the end of the ninth month, the fetus is about 20 inches in length and weighs 6-10 pounds. Body changes during your third trimester Your body will continue to go through many changes during pregnancy. The changes vary from woman to woman. During the third trimester:  Your weight will continue to increase. You can expect to gain 25-35 pounds (11-16 kg) by the end of the pregnancy.  You may begin to get stretch marks on your hips, abdomen, and breasts.  You may urinate more often because the fetus is moving lower into your pelvis and pressing on your bladder.  You may develop or continue to have heartburn. This is caused by increased hormones that slow down muscles in the digestive tract.  You may develop or continue to have constipation because increased hormones slow digestion and cause the muscles that push waste through your intestines to relax.  You may develop hemorrhoids. These are swollen veins (varicose veins) in the rectum that can itch or be painful.  You may develop swollen, bulging veins (varicose veins) in your legs.  You may have increased body aches in the pelvis, back, or thighs. This is due to weight gain and increased hormones that are relaxing your joints.  You may have changes in your hair. These can include thickening of your hair, rapid growth, and changes in texture. Some women also have hair loss during or after pregnancy, or hair that feels dry or thin. Your hair will most likely return to normal after your baby is born.  Your breasts will continue to grow and they will continue to become tender. A yellow fluid (colostrum) may leak from your breasts. This is the first milk you are producing for your baby.  Your belly button may stick out.  You may notice more swelling in your hands,  face, or ankles.  You may have increased tingling or numbness in your hands, arms, and legs. The skin on your belly may also feel numb.  You may feel short of breath because of your expanding uterus.  You may have more problems sleeping. This can be caused by the size of your belly, increased need to urinate, and an increase in your body's metabolism.  You may notice the fetus "dropping," or moving lower in your abdomen (lightening).  You may have increased vaginal discharge.  You may notice your joints feel loose and you may have pain around your pelvic bone.  What to expect at prenatal visits You will have prenatal exams every 2 weeks until week 36. Then you will have weekly prenatal exams. During a routine prenatal visit:  You will be weighed to make sure you and the baby are growing normally.  Your blood pressure will be taken.  Your abdomen will be measured to track your baby's growth.  The fetal heartbeat will be listened to.  Any test results from the previous visit will be discussed.  You may have a cervical check near your due date to see if your cervix has softened or thinned (effaced).  You will be tested for Group B streptococcus. This happens between 35 and 37 weeks.  Your health care provider may ask you:  What your birth plan is.  How you are feeling.  If you are feeling the baby move.  If you have had   any abnormal symptoms, such as leaking fluid, bleeding, severe headaches, or abdominal cramping.  If you are using any tobacco products, including cigarettes, chewing tobacco, and electronic cigarettes.  If you have any questions.  Other tests or screenings that may be performed during your third trimester include:  Blood tests that check for low iron levels (anemia).  Fetal testing to check the health, activity level, and growth of the fetus. Testing is done if you have certain medical conditions or if there are problems during the  pregnancy.  Nonstress test (NST). This test checks the health of your baby to make sure there are no signs of problems, such as the baby not getting enough oxygen. During this test, a belt is placed around your belly. The baby is made to move, and its heart rate is monitored during movement.  What is false labor? False labor is a condition in which you feel small, irregular tightenings of the muscles in the womb (contractions) that usually go away with rest, changing position, or drinking water. These are called Braxton Hicks contractions. Contractions may last for hours, days, or even weeks before true labor sets in. If contractions come at regular intervals, become more frequent, increase in intensity, or become painful, you should see your health care provider. What are the signs of labor?  Abdominal cramps.  Regular contractions that start at 10 minutes apart and become stronger and more frequent with time.  Contractions that start on the top of the uterus and spread down to the lower abdomen and back.  Increased pelvic pressure and dull back pain.  A watery or bloody mucus discharge that comes from the vagina.  Leaking of amniotic fluid. This is also known as your "water breaking." It could be a slow trickle or a gush. Let your health care provider know if it has a color or strange odor. If you have any of these signs, call your health care provider right away, even if it is before your due date. Follow these instructions at home: Medicines  Follow your health care provider's instructions regarding medicine use. Specific medicines may be either safe or unsafe to take during pregnancy.  Take a prenatal vitamin that contains at least 600 micrograms (mcg) of folic acid.  If you develop constipation, try taking a stool softener if your health care provider approves. Eating and drinking  Eat a balanced diet that includes fresh fruits and vegetables, whole grains, good sources of protein  such as meat, eggs, or tofu, and low-fat dairy. Your health care provider will help you determine the amount of weight gain that is right for you.  Avoid raw meat and uncooked cheese. These carry germs that can cause birth defects in the baby.  If you have low calcium intake from food, talk to your health care provider about whether you should take a daily calcium supplement.  Eat four or five small meals rather than three large meals a day.  Limit foods that are high in fat and processed sugars, such as fried and sweet foods.  To prevent constipation: ? Drink enough fluid to keep your urine clear or pale yellow. ? Eat foods that are high in fiber, such as fresh fruits and vegetables, whole grains, and beans. Activity  Exercise only as directed by your health care provider. Most women can continue their usual exercise routine during pregnancy. Try to exercise for 30 minutes at least 5 days a week. Stop exercising if you experience uterine contractions.  Avoid heavy   lifting.  Do not exercise in extreme heat or humidity, or at high altitudes.  Wear low-heel, comfortable shoes.  Practice good posture.  You may continue to have sex unless your health care provider tells you otherwise. Relieving pain and discomfort  Take frequent breaks and rest with your legs elevated if you have leg cramps or low back pain.  Take warm sitz baths to soothe any pain or discomfort caused by hemorrhoids. Use hemorrhoid cream if your health care provider approves.  Wear a good support bra to prevent discomfort from breast tenderness.  If you develop varicose veins: ? Wear support pantyhose or compression stockings as told by your healthcare provider. ? Elevate your feet for 15 minutes, 3-4 times a day. Prenatal care  Write down your questions. Take them to your prenatal visits.  Keep all your prenatal visits as told by your health care provider. This is important. Safety  Wear your seat belt at  all times when driving.  Make a list of emergency phone numbers, including numbers for family, friends, the hospital, and police and fire departments. General instructions  Avoid cat litter boxes and soil used by cats. These carry germs that can cause birth defects in the baby. If you have a cat, ask someone to clean the litter box for you.  Do not travel far distances unless it is absolutely necessary and only with the approval of your health care provider.  Do not use hot tubs, steam rooms, or saunas.  Do not drink alcohol.  Do not use any products that contain nicotine or tobacco, such as cigarettes and e-cigarettes. If you need help quitting, ask your health care provider.  Do not use any medicinal herbs or unprescribed drugs. These chemicals affect the formation and growth of the baby.  Do not douche or use tampons or scented sanitary pads.  Do not cross your legs for long periods of time.  To prepare for the arrival of your baby: ? Take prenatal classes to understand, practice, and ask questions about labor and delivery. ? Make a trial run to the hospital. ? Visit the hospital and tour the maternity area. ? Arrange for maternity or paternity leave through employers. ? Arrange for family and friends to take care of pets while you are in the hospital. ? Purchase a rear-facing car seat and make sure you know how to install it in your car. ? Pack your hospital bag. ? Prepare the baby's nursery. Make sure to remove all pillows and stuffed animals from the baby's crib to prevent suffocation.  Visit your dentist if you have not gone during your pregnancy. Use a soft toothbrush to brush your teeth and be gentle when you floss. Contact a health care provider if:  You are unsure if you are in labor or if your water has broken.  You become dizzy.  You have mild pelvic cramps, pelvic pressure, or nagging pain in your abdominal area.  You have lower back pain.  You have persistent  nausea, vomiting, or diarrhea.  You have an unusual or bad smelling vaginal discharge.  You have pain when you urinate. Get help right away if:  Your water breaks before 37 weeks.  You have regular contractions less than 5 minutes apart before 37 weeks.  You have a fever.  You are leaking fluid from your vagina.  You have spotting or bleeding from your vagina.  You have severe abdominal pain or cramping.  You have rapid weight loss or weight gain.    You have shortness of breath with chest pain.  You notice sudden or extreme swelling of your face, hands, ankles, feet, or legs.  Your baby makes fewer than 10 movements in 2 hours.  You have severe headaches that do not go away when you take medicine.  You have vision changes. Summary  The third trimester is from week 28 through week 40, months 7 through 9. The third trimester is a time when the unborn baby (fetus) is growing rapidly.  During the third trimester, your discomfort may increase as you and your baby continue to gain weight. You may have abdominal, leg, and back pain, sleeping problems, and an increased need to urinate.  During the third trimester your breasts will keep growing and they will continue to become tender. A yellow fluid (colostrum) may leak from your breasts. This is the first milk you are producing for your baby.  False labor is a condition in which you feel small, irregular tightenings of the muscles in the womb (contractions) that eventually go away. These are called Braxton Hicks contractions. Contractions may last for hours, days, or even weeks before true labor sets in.  Signs of labor can include: abdominal cramps; regular contractions that start at 10 minutes apart and become stronger and more frequent with time; watery or bloody mucus discharge that comes from the vagina; increased pelvic pressure and dull back pain; and leaking of amniotic fluid. This information is not intended to replace advice  given to you by your health care provider. Make sure you discuss any questions you have with your health care provider. Document Released: 02/10/2001 Document Revised: 07/25/2015 Document Reviewed: 04/19/2012 Elsevier Interactive Patient Education  2017 [employer]  Back Pain in Pregnancy Back pain during pregnancy is common. Back pain may be caused by several factors that are related to changes during your pregnancy. Follow these instructions at home: Managing pain, stiffness, and swelling  If directed, apply ice for sudden (acute) back pain. ? Put ice in a plastic bag. ? Place a towel between your skin and the bag. ? Leave the ice on for 20 minutes, 2-3 times per day.  If directed, apply heat to the affected area before you exercise: ? Place a towel between your skin and the heat pack or heating pad. ? Leave the heat on for 20-30 minutes. ? Remove the heat if your skin turns bright red. This is especially important if you are unable to feel pain, heat, or cold. You may have a greater risk of getting burned. Activity  Exercise as told by your health care provider. Exercising is the best way to prevent or manage back pain.  Listen to your body when lifting. If lifting hurts, ask for help or bend your knees. This uses your leg muscles instead of your back muscles.  Squat down when picking up something from the floor. Do not bend over.  Only use bed rest as told by your health care provider. Bed rest should only be used for the most severe episodes of back pain. Standing, Sitting, and Lying Down  Do not stand in one place for long periods of time.  Use good posture when sitting. Make sure your head rests over your shoulders and is not hanging forward. Use a pillow on your lower back if necessary.  Try sleeping on your side, preferably the left side, with a pillow or two between your legs. If you are sore after a night's rest, your bed may be too soft. A   A firm mattress may provide  more support for your back during pregnancy. General instructions  Do not wear high heels.  Eat a healthy diet. Try to gain weight within your health care provider's recommendations.  Use a maternity girdle, elastic sling, or back brace as told by your health care provider.  Take over-the-counter and prescription medicines only as told by your health care provider.  Keep all follow-up visits as told by your health care provider. This is important. This includes any visits with any specialists, such as a physical therapist. Contact a health care provider if:  Your back pain interferes with your daily activities.  You have increasing pain in other parts of your body. Get help right away if:  You develop numbness, tingling, weakness, or problems with the use of your arms or legs.  You develop severe back pain that is not controlled with medicine.  You have a sudden change in bowel or bladder control.  You develop shortness of breath, dizziness, or you faint.  You develop nausea, vomiting, or sweating.  You have back pain that is a rhythmic, cramping pain similar to labor pains. Labor pain is usually 1-2 minutes apart, lasts for about 1 minute, and involves a bearing down feeling or pressure in your pelvis.  You have back pain and your water breaks or you have vaginal bleeding.  You have back pain or numbness that travels down your leg.  Your back pain developed after you fell.  You develop pain on one side of your back.  You see blood in your urine.  You develop skin blisters in the area of your back pain. This information is not intended to replace advice given to you by your health care provider. Make sure you discuss any questions you have with your health care provider. Document Released: 05/27/2005 Document Revised: 07/25/2015 Document Reviewed: 10/31/2014 Elsevier Interactive Patient Education  [employer]2018 [employer]

## 2016-09-17 NOTE — Progress Notes (Signed)
Lower back cramping"menstrual like"

## 2016-09-17 NOTE — Progress Notes (Signed)
   PRENATAL VISIT NOTE  Subjective:  Amy Franklin is a 26 y.o. G2P1001 at 2063w3d being seen today for ongoing prenatal care.  She is currently monitored for the following issues for this low-risk pregnancy and has Supervision of normal pregnancy on her problem list.  Patient reports backache.  Contractions: Not present. Vag. Bleeding: None.  Movement: Present. Denies leaking of fluid.   The following portions of the patient's history were reviewed and updated as appropriate: allergies, current medications, past family history, past medical history, past social history, past surgical history and problem list. Problem list updated.  Objective:   Vitals:   09/17/16 1322  BP: 108/68  Pulse: 93  Weight: 168 lb (76.2 kg)    Fetal Status: Fetal Heart Rate (bpm): 156   Movement: Present     General:  Alert, oriented and cooperative. Patient is in no acute distress.  Skin: Skin is warm and dry. No rash noted.   Cardiovascular: Normal heart rate noted  Respiratory: Normal respiratory effort, no problems with respiration noted  Abdomen: Soft, gravid, appropriate for gestational age.  Pain/Pressure: Present     Pelvic: Cervical exam deferred        Extremities: Normal range of motion.  Edema: Trace  Mental Status:  Normal mood and affect. Normal behavior. Normal judgment and thought content.   Assessment and Plan:  Pregnancy: G2P1001 at 6763w3d  1. Encounter for supervision of other normal pregnancy in second trimester MS pain recommend back pain precautions and instructions  Preterm labor symptoms and general obstetric precautions including but not limited to vaginal bleeding, contractions, leaking of fluid and fetal movement were reviewed in detail with the patient. Please refer to After Visit Summary for other counseling recommendations.  Return in about 4 weeks (around 10/15/2016).   Scheryl DarterJames Maziyah Vessel, MD

## 2016-09-25 ENCOUNTER — Encounter: Payer: BLUE CROSS/BLUE SHIELD | Admitting: Certified Nurse Midwife

## 2016-10-16 ENCOUNTER — Encounter: Payer: Self-pay | Admitting: *Deleted

## 2016-10-16 ENCOUNTER — Ambulatory Visit (INDEPENDENT_AMBULATORY_CARE_PROVIDER_SITE_OTHER): Payer: BLUE CROSS/BLUE SHIELD | Admitting: Advanced Practice Midwife

## 2016-10-16 DIAGNOSIS — Z3483 Encounter for supervision of other normal pregnancy, third trimester: Secondary | ICD-10-CM

## 2016-10-16 NOTE — Progress Notes (Signed)
   PRENATAL VISIT NOTE  Subjective:  Amy Franklin is a 26 y.o. G2P1001 at [redacted]w[redacted]d being seen today for ongoing prenatal care.  She is currently monitored for the following issues for this low-risk pregnancy and has Supervision of normal pregnancy on her problem list.  Patient reports no complaints.  Contractions: Not present. Vag. Bleeding: None.  Movement: Present. Denies leaking of fluid.   The following portions of the patient's history were reviewed and updated as appropriate: allergies, current medications, past family history, past medical history, past social history, past surgical history and problem list. Problem list updated.  Objective:   Vitals:   10/16/16 0828  BP: 117/71  Pulse: (!) 112  Weight: 173 lb (78.5 kg)    Fetal Status: Fetal Heart Rate (bpm): 145 Fundal Height: 35 cm Movement: Present  Presentation: Vertex  General:  Alert, oriented and cooperative. Patient is in no acute distress.  Skin: Skin is warm and dry. No rash noted.   Cardiovascular: Normal heart rate noted  Respiratory: Normal respiratory effort, no problems with respiration noted  Abdomen: Soft, gravid, appropriate for gestational age.  Pain/Pressure: Present     Pelvic: Cervical exam deferred        Extremities: Normal range of motion.  Edema: None  Mental Status:  Normal mood and affect. Normal behavior. Normal judgment and thought content.   Assessment and Plan:  Pregnancy: G2P1001 at [redacted]w[redacted]d  There are no diagnoses linked to this encounter. Term labor symptoms and general obstetric precautions including but not limited to vaginal bleeding, contractions, leaking of fluid and fetal movement were reviewed in detail with the patient. Please refer to After Visit Summary for other counseling recommendations.  F/U 2 weeks   Dorathy Kinsman, [place]

## 2016-10-16 NOTE — Patient Instructions (Signed)

## 2016-10-30 ENCOUNTER — Ambulatory Visit (INDEPENDENT_AMBULATORY_CARE_PROVIDER_SITE_OTHER): Payer: BLUE CROSS/BLUE SHIELD | Admitting: Advanced Practice Midwife

## 2016-10-30 VITALS — BP 119/80 | HR 112 | Wt 173.0 lb

## 2016-10-30 DIAGNOSIS — Z331 Pregnant state, incidental: Secondary | ICD-10-CM

## 2016-10-30 DIAGNOSIS — Z113 Encounter for screening for infections with a predominantly sexual mode of transmission: Secondary | ICD-10-CM | POA: Diagnosis not present

## 2016-10-30 DIAGNOSIS — Z3493 Encounter for supervision of normal pregnancy, unspecified, third trimester: Secondary | ICD-10-CM

## 2016-10-30 LAB — OB RESULTS CONSOLE GBS: GBS: NEGATIVE

## 2016-10-30 LAB — OB RESULTS CONSOLE GC/CHLAMYDIA: Gonorrhea: NEGATIVE

## 2016-10-30 NOTE — Patient Instructions (Signed)

## 2016-10-30 NOTE — Progress Notes (Signed)
   PRENATAL VISIT NOTE  Subjective:  Amy Franklin is a 26 y.o. G2P1001 at 6441w4d being seen today for ongoing prenatal care.  She is currently monitored for the following issues for this low-risk pregnancy and has Supervision of normal pregnancy on her problem list.  Patient reports no complaints.  Contractions: Irregular. Vag. Bleeding: None.  Movement: Present. Denies leaking of fluid.   The following portions of the patient's history were reviewed and updated as appropriate: allergies, current medications, past family history, past medical history, past social history, past surgical history and problem list. Problem list updated.  Objective:   Vitals:   10/30/16 1037  BP: 119/80  Pulse: (!) 112  Weight: 173 lb (78.5 kg)    Fetal Status: Fetal Heart Rate (bpm): 158   Movement: Present  Presentation: Vertex  General:  Alert, oriented and cooperative. Patient is in no acute distress.  Skin: Skin is warm and dry. No rash noted.   Cardiovascular: Normal heart rate noted  Respiratory: Normal respiratory effort, no problems with respiration noted  Abdomen: Soft, gravid, appropriate for gestational age.  Pain/Pressure: Present     Pelvic: Cervical exam performed Dilation: Fingertip Effacement (%): 60 Station: -2  Extremities: Normal range of motion.  Edema: None  Mental Status:  Normal mood and affect. Normal behavior. Normal judgment and thought content.   Assessment and Plan:  Pregnancy: G2P1001 at 7241w4d  1. Encounter for supervision of normal pregnancy in third trimester, unspecified gravidity --[employer] waterbirth, has purchased tub/supplies.  Had successful waterbirth last pregnancy.   --Anticipatory guidance given about next prenatal visits/weeks of pregnancy - Culture, beta strep (group b only) - GC[facility]Chlamydia [facility] amp [facility]  Term labor symptoms and general obstetric precautions including but not limited to vaginal bleeding, contractions, leaking of fluid  and fetal movement were reviewed in detail with the patient. Please refer to After Visit Summary for other counseling recommendations.  Return in about 1 week (around 11/06/2016).   Sharen CounterLisa Leftwich-Kirby, CNM

## 2016-11-01 LAB — CULTURE, BETA STREP (GROUP B ONLY)

## 2016-11-02 ENCOUNTER — Encounter: Payer: Self-pay | Admitting: Advanced Practice Midwife

## 2016-11-02 DIAGNOSIS — O99820 Streptococcus B carrier state complicating pregnancy: Secondary | ICD-10-CM | POA: Insufficient documentation

## 2016-11-05 LAB — GC/CHLAMYDIA PROBE AMP (~~LOC~~) NOT AT ARMC
CHLAMYDIA, DNA PROBE: NEGATIVE
Neisseria Gonorrhea: NEGATIVE

## 2016-11-06 ENCOUNTER — Ambulatory Visit (INDEPENDENT_AMBULATORY_CARE_PROVIDER_SITE_OTHER): Payer: BLUE CROSS/BLUE SHIELD | Admitting: Advanced Practice Midwife

## 2016-11-06 ENCOUNTER — Encounter: Payer: Self-pay | Admitting: Advanced Practice Midwife

## 2016-11-06 VITALS — BP 121/84 | HR 119 | Wt 174.0 lb

## 2016-11-06 DIAGNOSIS — L298 Other pruritus: Secondary | ICD-10-CM

## 2016-11-06 DIAGNOSIS — N898 Other specified noninflammatory disorders of vagina: Secondary | ICD-10-CM

## 2016-11-06 DIAGNOSIS — Z3493 Encounter for supervision of normal pregnancy, unspecified, third trimester: Secondary | ICD-10-CM

## 2016-11-06 NOTE — Patient Instructions (Signed)
Vaginal Delivery Vaginal delivery means that you will give birth by pushing your baby out of your birth canal (vagina). A team of health care providers will help you before, during, and after vaginal delivery. Birth experiences are unique for every woman and every pregnancy, and birth experiences vary depending on where you choose to give birth. What should I do to prepare for my baby's birth? Before your baby is born, it is important to talk with your health care provider about:  Your labor and delivery preferences. These may include: ? Medicines that you may be given. ? How you will manage your pain. This might include non-medical pain relief techniques or injectable pain relief such as epidural analgesia. ? How you and your baby will be monitored during labor and delivery. ? Who may be in the labor and delivery room with you. ? Your feelings about surgical delivery of your baby (cesarean delivery, or C-section) if this becomes necessary. ? Your feelings about receiving donated blood through an IV tube (blood transfusion) if this becomes necessary.  Whether you are able: ? To take pictures or videos of the birth. ? To eat during labor and delivery. ? To move around, walk, or change positions during labor and delivery.  What to expect after your baby is born, such as: ? Whether delayed umbilical cord clamping and cutting is offered. ? Who will care for your baby right after birth. ? Medicines or tests that may be recommended for your baby. ? Whether breastfeeding is supported in your hospital or birth center. ? How long you will be in the hospital or birth center.  How any medical conditions you have may affect your baby or your labor and delivery experience.  To prepare for your baby's birth, you should also:  Attend all of your health care visits before delivery (prenatal visits) as recommended by your health care provider. This is important.  Prepare your home for your baby's  arrival. Make sure that you have: ? Diapers. ? Baby clothing. ? Feeding equipment. ? Safe sleeping arrangements for you and your baby.  Install a car seat in your vehicle. Have your car seat checked by a certified car seat installer to make sure that it is installed safely.  Think about who will help you with your new baby at home for at least the first several weeks after delivery.  What can I expect when I arrive at the birth center or hospital? Once you are in labor and have been admitted into the hospital or birth center, your health care provider may:  Review your pregnancy history and any concerns you have.  Insert an IV tube into one of your veins. This is used to give you fluids and medicines.  Check your blood pressure, pulse, temperature, and heart rate (vital signs).  Check whether your bag of water (amniotic sac) has broken (ruptured).  Talk with you about your birth plan and discuss pain control options.  Monitoring Your health care provider may monitor your contractions (uterine monitoring) and your baby's heart rate (fetal monitoring). You may need to be monitored:  Often, but not continuously (intermittently).  All the time or for long periods at a time (continuously). Continuous monitoring may be needed if: ? You are taking certain medicines, such as medicine to relieve pain or make your contractions stronger. ? You have pregnancy or labor complications.  Monitoring may be done by:  Placing a special stethoscope or a handheld monitoring device on your abdomen to   check your baby's heartbeat, and feeling your abdomen for contractions. This method of monitoring does not continuously record your baby's heartbeat or your contractions.  Placing monitors on your abdomen (external monitors) to record your baby's heartbeat and the frequency and length of contractions. You may not have to wear external monitors all the time.  Placing monitors inside of your uterus  (internal monitors) to record your baby's heartbeat and the frequency, length, and strength of your contractions. ? Your health care provider may use internal monitors if he or she needs more information about the strength of your contractions or your baby's heart rate. ? Internal monitors are put in place by passing a thin, flexible wire through your vagina and into your uterus. Depending on the type of monitor, it may remain in your uterus or on your baby's head until birth. ? Your health care provider will discuss the benefits and risks of internal monitoring with you and will ask for your permission before inserting the monitors.  Telemetry. This is a type of continuous monitoring that can be done with external or internal monitors. Instead of having to stay in bed, you are able to move around during telemetry. Ask your health care provider if telemetry is an option for you.  Physical exam Your health care provider may perform a physical exam. This may include:  Checking whether your baby is positioned: ? With the head toward your vagina (head-down). This is most common. ? With the head toward the top of your uterus (head-up or breech). If your baby is in a breech position, your health care provider may try to turn your baby to a head-down position so you can deliver vaginally. If it does not seem that your baby can be born vaginally, your provider may recommend surgery to deliver your baby. In rare cases, you may be able to deliver vaginally if your baby is head-up (breech delivery). ? Lying sideways (transverse). Babies that are lying sideways cannot be delivered vaginally.  Checking your cervix to determine: ? Whether it is thinning out (effacing). ? Whether it is opening up (dilating). ? How low your baby has moved into your birth canal.  What are the three stages of labor and delivery?  Normal labor and delivery is divided into the following three stages: Stage 1  Stage 1 is the  longest stage of labor, and it can last for hours or days. Stage 1 includes: ? Early labor. This is when contractions may be irregular, or regular and mild. Generally, early labor contractions are more than 10 minutes apart. ? Active labor. This is when contractions get longer, more regular, more frequent, and more intense. ? The transition phase. This is when contractions happen very close together, are very intense, and may last longer than during any other part of labor.  Contractions generally feel mild, infrequent, and irregular at first. They get stronger, more frequent (about every 2-3 minutes), and more regular as you progress from early labor through active labor and transition.  Many women progress through stage 1 naturally, but you may need help to continue making progress. If this happens, your health care provider may talk with you about: ? Rupturing your amniotic sac if it has not ruptured yet. ? Giving you medicine to help make your contractions stronger and more frequent.  Stage 1 ends when your cervix is completely dilated to 4 inches (10 cm) and completely effaced. This happens at the end of the transition phase. Stage 2  Once   your cervix is completely effaced and dilated to 4 inches (10 cm), you may start to feel an urge to push. It is common for the body to naturally take a rest before feeling the urge to push, especially if you received an epidural or certain other pain medicines. This rest period may last for up to 1-2 hours, depending on your unique labor experience.  During stage 2, contractions are generally less painful, because pushing helps relieve contraction pain. Instead of contraction pain, you may feel stretching and burning pain, especially when the widest part of your baby's head passes through the vaginal opening (crowning).  Your health care provider will closely monitor your pushing progress and your baby's progress through the vagina during stage 2.  Your  health care provider may massage the area of skin between your vaginal opening and anus (perineum) or apply warm compresses to your perineum. This helps it stretch as the baby's head starts to crown, which can help prevent perineal tearing. ? In some cases, an incision may be made in your perineum (episiotomy) to allow the baby to pass through the vaginal opening. An episiotomy helps to make the opening of the vagina larger to allow more room for the baby to fit through.  It is very important to breathe and focus so your health care provider can control the delivery of your baby's head. Your health care provider may have you decrease the intensity of your pushing, to help prevent perineal tearing.  After delivery of your baby's head, the shoulders and the rest of the body generally deliver very quickly and without difficulty.  Once your baby is delivered, the umbilical cord may be cut right away, or this may be delayed for 1-2 minutes, depending on your baby's health. This may vary among health care providers, hospitals, and birth centers.  If you and your baby are healthy enough, your baby may be placed on your chest or abdomen to help maintain the baby's temperature and to help you bond with each other. Some mothers and babies start breastfeeding at this time. Your health care team will dry your baby and help keep your baby warm during this time.  Your baby may need immediate care if he or she: ? Showed signs of distress during labor. ? Has a medical condition. ? Was born too early (prematurely). ? Had a bowel movement before birth (meconium). ? Shows signs of difficulty transitioning from being inside the uterus to being outside of the uterus. If you are planning to breastfeed, your health care team will help you begin a feeding. Stage 3  The third stage of labor starts immediately after the birth of your baby and ends after you deliver the placenta. The placenta is an organ that develops  during pregnancy to provide oxygen and nutrients to your baby in the womb.  Delivering the placenta may require some pushing, and you may have mild contractions. Breastfeeding can stimulate contractions to help you deliver the placenta.  After the placenta is delivered, your uterus should tighten (contract) and become firm. This helps to stop bleeding in your uterus. To help your uterus contract and to control bleeding, your health care provider may: ? Give you medicine by injection, through an IV tube, by mouth, or through your rectum (rectally). ? Massage your abdomen or perform a vaginal exam to remove any blood clots that are left in your uterus. ? Empty your bladder by placing a thin, flexible tube (catheter) into your bladder. ? Encourage   you to breastfeed your baby. After labor is over, you and your baby will be monitored closely to ensure that you are both healthy until you are ready to go home. Your health care team will teach you how to care for yourself and your baby. This information is not intended to replace advice given to you by your health care provider. Make sure you discuss any questions you have with your health care provider. Document Released: 11/26/2007 Document Revised: 09/06/2015 Document Reviewed: 03/03/2015 Elsevier Interactive Patient Education  2018 [employer]  

## 2016-11-06 NOTE — Progress Notes (Signed)
Pt feels as if she might have a yeast infection

## 2016-11-06 NOTE — Progress Notes (Signed)
   PRENATAL VISIT NOTE  Subjective:  Amy Franklin is a 26 y.o. G2P1001 at 8122w4d being seen today for ongoing prenatal care.  She is currently monitored for the following issues for this low-risk pregnancy and has Supervision of normal pregnancy and GBS (group B Streptococcus carrier), +RV culture, currently pregnant on her problem list.  Patient reports vaginal irritation.  Contractions: Irregular. Vag. Bleeding: None.  Movement: Present. Denies leaking of fluid.   The following portions of the patient's history were reviewed and updated as appropriate: allergies, current medications, past family history, past medical history, past social history, past surgical history and problem list. Problem list updated.  Objective:   Vitals:   11/06/16 0944  BP: 121/84  Pulse: (!) 119  Weight: 174 lb (78.9 kg)    Fetal Status: Fetal Heart Rate (bpm): 155   Movement: Present     General:  Alert, oriented and cooperative. Patient is in no acute distress.  Skin: Skin is warm and dry. No rash noted.   Cardiovascular: Normal heart rate noted  Respiratory: Normal respiratory effort, no problems with respiration noted  Abdomen: Soft, gravid, appropriate for gestational age.  Pain/Pressure: Present     Pelvic: Cervical exam performed      1/60/-2 Wet prep collected  Extremities: Normal range of motion.  Edema: None  Mental Status:  Normal mood and affect. Normal behavior. Normal judgment and thought content.   Assessment and Plan:  Pregnancy: G2P1001 at 8122w4d  1. Vaginal itching Wet prep - Cervicovaginal ancillary only  2. Encounter for supervision of normal pregnancy in third trimester, unspecified gravidity Discussed labor  Term labor symptoms and general obstetric precautions including but not limited to vaginal bleeding, contractions, leaking of fluid and fetal movement were reviewed in detail with the patient. Please refer to After Visit Summary for other counseling recommendations.    Return in about 1 week (around 11/13/2016) [place] [facility].   Wynelle BourgeoisMarie Destyne Goodreau, CNM

## 2016-11-07 ENCOUNTER — Encounter (HOSPITAL_COMMUNITY): Payer: Self-pay | Admitting: *Deleted

## 2016-11-07 ENCOUNTER — Inpatient Hospital Stay (HOSPITAL_COMMUNITY)
Admission: AD | Admit: 2016-11-07 | Discharge: 2016-11-07 | Disposition: A | Discharge status: Home / Self Care | Payer: BLUE CROSS/BLUE SHIELD | Source: Ambulatory Visit | Attending: Obstetrics and Gynecology | Admitting: Obstetrics and Gynecology

## 2016-11-07 DIAGNOSIS — R000 Tachycardia, unspecified: Secondary | ICD-10-CM | POA: Diagnosis not present

## 2016-11-07 DIAGNOSIS — O99820 Streptococcus B carrier state complicating pregnancy: Secondary | ICD-10-CM

## 2016-11-07 DIAGNOSIS — O479 False labor, unspecified: Secondary | ICD-10-CM

## 2016-11-07 DIAGNOSIS — O471 False labor at or after 37 completed weeks of gestation: Secondary | ICD-10-CM | POA: Diagnosis not present

## 2016-11-07 DIAGNOSIS — Z3A38 38 weeks gestation of pregnancy: Secondary | ICD-10-CM | POA: Diagnosis not present

## 2016-11-07 DIAGNOSIS — O26893 Other specified pregnancy related conditions, third trimester: Secondary | ICD-10-CM | POA: Insufficient documentation

## 2016-11-07 HISTORY — DX: Other specified health status: Z78.9

## 2016-11-07 NOTE — MAU Note (Signed)
Having contractions about 4mins apart since 2230. Ctxs started at 1500 but more regular at 2230. Some bloody show. Denies LOF. 1cm on Friday

## 2016-11-07 NOTE — MAU Provider Note (Signed)
S: Ms. Amy Franklin is a 26 y.o. G2P1001 at 3865w5d  who presents to MAU today complaining of leaking of contractions. RN asked me to see the patient because of elevated heart rate. Patient denies any CP, palpations or shortness of breath. One her last 3 prenatal visits her pulse has been elevated 112-119. She denies any VB or LOF. She reports normal fetal movement. She has had contractions today.    O: BP 118/80   Pulse (!) 122   Temp 98.5 F (36.9 C)   Resp 18   Ht 5' (1.524 m)   Wt 173 lb (78.5 kg)   LMP 02/10/2016   SpO2 99%   BMI 33.79 kg/m  GENERAL: Well-developed, well-nourished female in no acute distress.  HEAD: Normocephalic, atraumatic.  CHEST: Normal effort of breathing, heart sounds normal.  ABDOMEN: Soft, nontender, gravid  Cervical exam:  Dilation: 1.5 Effacement (%): 70 Cervical Position: Anterior Station: -1 Presentation: Vertex Exam by:: Quintella BatonJo Barham rNC   No cervical change over more than one hour.     Fetal Monitoring: Baseline: 150 Variability: moderate Accelerations: 15x15 Decelerations: no decels Contractions: every 3-5 mins  EKG: sinus tach  A: SIUP at 965w5d    P: DC home   Armando ReichertHogan, Sahiba Granholm D, CNM 11/07/2016 3:18 AM

## 2016-11-07 NOTE — Progress Notes (Signed)
Thressa ShellerHeather Hogan CNM aware of pt's admission and tachycardia. Also aware of sve and efm tracing reviewed

## 2016-11-07 NOTE — Discharge Instructions (Signed)

## 2016-11-07 NOTE — Progress Notes (Signed)
Written and verbal d/c instructions given and understanding voiced. 

## 2016-11-07 NOTE — Progress Notes (Signed)
EKG done

## 2016-11-09 LAB — CERVICOVAGINAL ANCILLARY ONLY
BACTERIAL VAGINITIS: NEGATIVE
Candida vaginitis: NEGATIVE

## 2016-11-11 ENCOUNTER — Inpatient Hospital Stay (HOSPITAL_COMMUNITY)
Admission: AD | Admit: 2016-11-11 | Discharge: 2016-11-14 | DRG: 767 | Disposition: A | Discharge status: Home / Self Care | Payer: BLUE CROSS/BLUE SHIELD | Source: Ambulatory Visit | Attending: Maternal & Fetal Medicine | Admitting: Maternal & Fetal Medicine

## 2016-11-11 ENCOUNTER — Encounter (HOSPITAL_COMMUNITY): Payer: Self-pay | Admitting: *Deleted

## 2016-11-11 DIAGNOSIS — Z6833 Body mass index (BMI) 33.0-33.9, adult: Secondary | ICD-10-CM

## 2016-11-11 DIAGNOSIS — E669 Obesity, unspecified: Secondary | ICD-10-CM | POA: Diagnosis present

## 2016-11-11 DIAGNOSIS — O99824 Streptococcus B carrier state complicating childbirth: Principal | ICD-10-CM | POA: Diagnosis present

## 2016-11-11 DIAGNOSIS — Z880 Allergy status to penicillin: Secondary | ICD-10-CM

## 2016-11-11 DIAGNOSIS — Z3A39 39 weeks gestation of pregnancy: Secondary | ICD-10-CM

## 2016-11-11 DIAGNOSIS — Z91040 Latex allergy status: Secondary | ICD-10-CM

## 2016-11-11 DIAGNOSIS — O99214 Obesity complicating childbirth: Secondary | ICD-10-CM | POA: Diagnosis present

## 2016-11-11 DIAGNOSIS — O99820 Streptococcus B carrier state complicating pregnancy: Secondary | ICD-10-CM

## 2016-11-11 DIAGNOSIS — Z302 Encounter for sterilization: Secondary | ICD-10-CM

## 2016-11-12 ENCOUNTER — Encounter (HOSPITAL_COMMUNITY): Payer: Self-pay | Admitting: Family Medicine

## 2016-11-12 ENCOUNTER — Inpatient Hospital Stay (HOSPITAL_COMMUNITY): Payer: BLUE CROSS/BLUE SHIELD | Admitting: Registered Nurse

## 2016-11-12 ENCOUNTER — Encounter (HOSPITAL_COMMUNITY)
Admission: AD | Disposition: A | Discharge status: Home / Self Care | Payer: Self-pay | Source: Ambulatory Visit | Attending: Maternal & Fetal Medicine

## 2016-11-12 DIAGNOSIS — Z880 Allergy status to penicillin: Secondary | ICD-10-CM | POA: Diagnosis not present

## 2016-11-12 DIAGNOSIS — Z91040 Latex allergy status: Secondary | ICD-10-CM | POA: Diagnosis not present

## 2016-11-12 DIAGNOSIS — O99214 Obesity complicating childbirth: Secondary | ICD-10-CM | POA: Diagnosis present

## 2016-11-12 DIAGNOSIS — Z302 Encounter for sterilization: Secondary | ICD-10-CM | POA: Diagnosis not present

## 2016-11-12 DIAGNOSIS — Z3493 Encounter for supervision of normal pregnancy, unspecified, third trimester: Secondary | ICD-10-CM | POA: Diagnosis present

## 2016-11-12 DIAGNOSIS — Z3A39 39 weeks gestation of pregnancy: Secondary | ICD-10-CM

## 2016-11-12 DIAGNOSIS — O99824 Streptococcus B carrier state complicating childbirth: Secondary | ICD-10-CM | POA: Diagnosis present

## 2016-11-12 DIAGNOSIS — Z6833 Body mass index (BMI) 33.0-33.9, adult: Secondary | ICD-10-CM | POA: Diagnosis not present

## 2016-11-12 DIAGNOSIS — E669 Obesity, unspecified: Secondary | ICD-10-CM | POA: Diagnosis present

## 2016-11-12 HISTORY — PX: TUBAL LIGATION: SHX77

## 2016-11-12 LAB — CBC
HEMATOCRIT: 36.9 % (ref 36.0–46.0)
HEMOGLOBIN: 13.4 g/dL (ref 12.0–15.0)
MCH: 30.5 pg (ref 26.0–34.0)
MCHC: 36.3 g/dL — ABNORMAL HIGH (ref 30.0–36.0)
MCV: 84.1 fL (ref 78.0–100.0)
Platelets: 169 10*3/uL (ref 150–400)
RBC: 4.39 MIL/uL (ref 3.87–5.11)
RDW: 13.6 % (ref 11.5–15.5)
WBC: 16 10*3/uL — AB (ref 4.0–10.5)

## 2016-11-12 LAB — TYPE AND SCREEN
ABO/RH(D): A POS
Antibody Screen: NEGATIVE

## 2016-11-12 LAB — RPR: RPR Ser Ql: NONREACTIVE

## 2016-11-12 SURGERY — LIGATION, FALLOPIAN TUBE, POSTPARTUM
Anesthesia: Spinal | Site: Abdomen | Laterality: Bilateral

## 2016-11-12 MED ORDER — LACTATED RINGERS IV SOLN: 500.0000 mL | INTRAVENOUS | Status: DC | PRN

## 2016-11-12 MED ORDER — BUPIVACAINE HCL (PF) 0.25 % IJ SOLN
INTRAMUSCULAR | Status: AC
  Filled 2016-11-12: qty 30

## 2016-11-12 MED ORDER — MIDAZOLAM HCL 2 MG/2ML IJ SOLN
INTRAMUSCULAR | Status: AC
  Filled 2016-11-12: qty 2

## 2016-11-12 MED ORDER — HYDROMORPHONE HCL 1 MG/ML IJ SOLN: 0.2500 mg | INTRAMUSCULAR | Status: DC | PRN

## 2016-11-12 MED ORDER — HYDROMORPHONE HCL 1 MG/ML IJ SOLN: 1.0000 mg | INTRAMUSCULAR | Status: DC | PRN

## 2016-11-12 MED ORDER — FAMOTIDINE 20 MG PO TABS
40.0000 mg | ORAL_TABLET | Freq: Once | ORAL | Status: AC
  Administered 2016-11-12: 40 mg via ORAL
  Filled 2016-11-12: qty 2

## 2016-11-12 MED ORDER — ACETAMINOPHEN 325 MG PO TABS
650.0000 mg | ORAL_TABLET | ORAL | Status: DC | PRN
  Administered 2016-11-12: 650 mg via ORAL
  Filled 2016-11-12: qty 2

## 2016-11-12 MED ORDER — OXYCODONE-ACETAMINOPHEN 5-325 MG PO TABS: 1.0000 | ORAL_TABLET | Freq: Four times a day (QID) | ORAL | Status: DC | PRN

## 2016-11-12 MED ORDER — LIDOCAINE HCL (PF) 1 % IJ SOLN
30.0000 mL | INTRAMUSCULAR | Status: DC | PRN
  Filled 2016-11-12: qty 30

## 2016-11-12 MED ORDER — LIDOCAINE 2% (20 MG/ML) 5 ML SYRINGE
INTRAMUSCULAR | Status: DC | PRN
  Administered 2016-11-12: 40 mg via INTRAVENOUS

## 2016-11-12 MED ORDER — CEFAZOLIN SODIUM-DEXTROSE 1-4 GM/50ML-% IV SOLN
1.0000 g | Freq: Three times a day (TID) | INTRAVENOUS | Status: DC
  Filled 2016-11-12: qty 50

## 2016-11-12 MED ORDER — LACTATED RINGERS IV SOLN
INTRAVENOUS | Status: DC
  Administered 2016-11-12: 09:00:00 via INTRAVENOUS

## 2016-11-12 MED ORDER — SIMETHICONE 80 MG PO CHEW: 80.0000 mg | CHEWABLE_TABLET | ORAL | Status: DC | PRN

## 2016-11-12 MED ORDER — ZOLPIDEM TARTRATE 5 MG PO TABS: 5.0000 mg | ORAL_TABLET | Freq: Every evening | ORAL | Status: DC | PRN

## 2016-11-12 MED ORDER — IBUPROFEN 600 MG PO TABS
600.0000 mg | ORAL_TABLET | Freq: Four times a day (QID) | ORAL | Status: DC
  Administered 2016-11-12 – 2016-11-14 (×8): 600 mg via ORAL
  Filled 2016-11-12 (×8): qty 1

## 2016-11-12 MED ORDER — BUPIVACAINE IN DEXTROSE 0.75-8.25 % IT SOLN
INTRATHECAL | Status: DC | PRN
  Administered 2016-11-12: 1.6 mL via INTRATHECAL

## 2016-11-12 MED ORDER — FLEET ENEMA 7-19 GM/118ML RE ENEM: 1.0000 | ENEMA | RECTAL | Status: DC | PRN

## 2016-11-12 MED ORDER — MEPERIDINE HCL 25 MG/ML IJ SOLN: 6.2500 mg | INTRAMUSCULAR | Status: DC | PRN

## 2016-11-12 MED ORDER — INFLUENZA VAC SPLIT QUAD 0.5 ML IM SUSY
0.5000 mL | PREFILLED_SYRINGE | INTRAMUSCULAR | Status: AC
  Administered 2016-11-13: 0.5 mL via INTRAMUSCULAR
  Filled 2016-11-12: qty 0.5

## 2016-11-12 MED ORDER — MIDAZOLAM HCL 5 MG/5ML IJ SOLN
INTRAMUSCULAR | Status: DC | PRN
  Administered 2016-11-12: 2 mg via INTRAVENOUS

## 2016-11-12 MED ORDER — FENTANYL CITRATE (PF) 100 MCG/2ML IJ SOLN
INTRAMUSCULAR | Status: AC
  Filled 2016-11-12: qty 2

## 2016-11-12 MED ORDER — SENNOSIDES-DOCUSATE SODIUM 8.6-50 MG PO TABS
2.0000 | ORAL_TABLET | ORAL | Status: DC
  Administered 2016-11-12 – 2016-11-14 (×2): 2 via ORAL
  Filled 2016-11-12 (×2): qty 2

## 2016-11-12 MED ORDER — BENZOCAINE-MENTHOL 20-0.5 % EX AERO: 1.0000 "application " | INHALATION_SPRAY | CUTANEOUS | Status: DC | PRN

## 2016-11-12 MED ORDER — PROPOFOL 10 MG/ML IV BOLUS
INTRAVENOUS | Status: AC
  Filled 2016-11-12: qty 20

## 2016-11-12 MED ORDER — OXYCODONE-ACETAMINOPHEN 5-325 MG PO TABS: 1.0000 | ORAL_TABLET | ORAL | Status: DC | PRN

## 2016-11-12 MED ORDER — COCONUT OIL OIL
1.0000 "application " | TOPICAL_OIL | Status: DC | PRN
  Administered 2016-11-13: 1 via TOPICAL
  Filled 2016-11-12: qty 120

## 2016-11-12 MED ORDER — FENTANYL CITRATE (PF) 100 MCG/2ML IJ SOLN: 100.0000 ug | INTRAMUSCULAR | Status: DC | PRN

## 2016-11-12 MED ORDER — METOCLOPRAMIDE HCL 10 MG PO TABS
10.0000 mg | ORAL_TABLET | Freq: Once | ORAL | Status: AC
  Administered 2016-11-12: 10 mg via ORAL
  Filled 2016-11-12: qty 1

## 2016-11-12 MED ORDER — OXYTOCIN BOLUS FROM INFUSION: 500.0000 mL | Freq: Once | INTRAVENOUS | Status: DC

## 2016-11-12 MED ORDER — SOD CITRATE-CITRIC ACID 500-334 MG/5ML PO SOLN: 30.0000 mL | ORAL | Status: DC | PRN

## 2016-11-12 MED ORDER — BUPIVACAINE HCL 0.25 % IJ SOLN
INTRAMUSCULAR | Status: DC | PRN
  Administered 2016-11-12: 10 mL

## 2016-11-12 MED ORDER — OXYCODONE-ACETAMINOPHEN 5-325 MG PO TABS: 2.0000 | ORAL_TABLET | ORAL | Status: DC | PRN

## 2016-11-12 MED ORDER — ONDANSETRON HCL 4 MG/2ML IJ SOLN
INTRAMUSCULAR | Status: DC | PRN
  Administered 2016-11-12: 4 mg via INTRAVENOUS

## 2016-11-12 MED ORDER — FENTANYL CITRATE (PF) 100 MCG/2ML IJ SOLN
INTRAMUSCULAR | Status: DC | PRN
  Administered 2016-11-12 (×2): 50 ug via INTRAVENOUS

## 2016-11-12 MED ORDER — PROPOFOL 10 MG/ML IV BOLUS
INTRAVENOUS | Status: DC | PRN
  Administered 2016-11-12 (×6): 20 mg via INTRAVENOUS

## 2016-11-12 MED ORDER — ONDANSETRON HCL 4 MG/2ML IJ SOLN: 4.0000 mg | INTRAMUSCULAR | Status: DC | PRN

## 2016-11-12 MED ORDER — METOCLOPRAMIDE HCL 5 MG/ML IJ SOLN: 10.0000 mg | Freq: Once | INTRAMUSCULAR | Status: DC | PRN

## 2016-11-12 MED ORDER — WITCH HAZEL-GLYCERIN EX PADS: 1.0000 "application " | MEDICATED_PAD | CUTANEOUS | Status: DC | PRN

## 2016-11-12 MED ORDER — ONDANSETRON HCL 4 MG/2ML IJ SOLN: 4.0000 mg | Freq: Four times a day (QID) | INTRAMUSCULAR | Status: DC | PRN

## 2016-11-12 MED ORDER — ONDANSETRON HCL 4 MG/2ML IJ SOLN
INTRAMUSCULAR | Status: AC
  Filled 2016-11-12: qty 2

## 2016-11-12 MED ORDER — CEFAZOLIN SODIUM-DEXTROSE 2-4 GM/100ML-% IV SOLN
2.0000 g | Freq: Once | INTRAVENOUS | Status: AC
  Administered 2016-11-12: 2 g via INTRAVENOUS

## 2016-11-12 MED ORDER — BUPIVACAINE IN DEXTROSE 0.75-8.25 % IT SOLN
INTRATHECAL | Status: AC
  Filled 2016-11-12: qty 2

## 2016-11-12 MED ORDER — KETOROLAC TROMETHAMINE 30 MG/ML IJ SOLN
INTRAMUSCULAR | Status: AC
  Filled 2016-11-12: qty 1

## 2016-11-12 MED ORDER — ONDANSETRON HCL 4 MG PO TABS: 4.0000 mg | ORAL_TABLET | ORAL | Status: DC | PRN

## 2016-11-12 MED ORDER — LACTATED RINGERS IV SOLN: INTRAVENOUS | Status: DC

## 2016-11-12 MED ORDER — LIDOCAINE HCL (CARDIAC) 20 MG/ML IV SOLN
INTRAVENOUS | Status: AC
  Filled 2016-11-12: qty 5

## 2016-11-12 MED ORDER — OXYTOCIN 10 UNIT/ML IJ SOLN
INTRAMUSCULAR | Status: AC
  Administered 2016-11-12: 10 [IU] via INTRAMUSCULAR
  Filled 2016-11-12: qty 1

## 2016-11-12 MED ORDER — PRENATAL MULTIVITAMIN CH
1.0000 | ORAL_TABLET | Freq: Every day | ORAL | Status: DC
  Administered 2016-11-12 – 2016-11-13 (×2): 1 via ORAL
  Filled 2016-11-12 (×2): qty 1

## 2016-11-12 MED ORDER — OXYTOCIN 10 UNIT/ML IJ SOLN
10.0000 [IU] | Freq: Once | INTRAMUSCULAR | Status: AC
  Administered 2016-11-12: 10 [IU] via INTRAMUSCULAR

## 2016-11-12 MED ORDER — OXYTOCIN 40 UNITS IN LACTATED RINGERS INFUSION - SIMPLE MED: 2.5000 [IU]/h | INTRAVENOUS | Status: DC

## 2016-11-12 MED ORDER — KETOROLAC TROMETHAMINE 30 MG/ML IJ SOLN
INTRAMUSCULAR | Status: DC | PRN
  Administered 2016-11-12: 30 mg via INTRAVENOUS

## 2016-11-12 MED ORDER — DIBUCAINE 1 % RE OINT: 1.0000 "application " | TOPICAL_OINTMENT | RECTAL | Status: DC | PRN

## 2016-11-12 MED ORDER — DIPHENHYDRAMINE HCL 25 MG PO CAPS: 25.0000 mg | ORAL_CAPSULE | Freq: Four times a day (QID) | ORAL | Status: DC | PRN

## 2016-11-12 MED ORDER — TETANUS-DIPHTH-ACELL PERTUSSIS 5-2.5-18.5 LF-MCG/0.5 IM SUSP: 0.5000 mL | Freq: Once | INTRAMUSCULAR | Status: DC

## 2016-11-12 SURGICAL SUPPLY — 22 items
CLOTH BEACON ORANGE TIMEOUT ST (SAFETY) ×3 IMPLANT
CONTAINER PREFILL 10% NBF 15ML (MISCELLANEOUS) ×6 IMPLANT
DRSG OPSITE POSTOP 3X4 (GAUZE/BANDAGES/DRESSINGS) ×3 IMPLANT
DURAPREP 26ML APPLICATOR (WOUND CARE) ×3 IMPLANT
GLOVE BIOGEL PI IND STRL 7.0 (GLOVE) ×1 IMPLANT
GLOVE BIOGEL PI IND STRL 9 (GLOVE) ×2 IMPLANT
GLOVE BIOGEL PI INDICATOR 7.0 (GLOVE) ×2
GLOVE BIOGEL PI INDICATOR 9 (GLOVE) ×4
GLOVE ECLIPSE 9.0 STRL (GLOVE) ×3 IMPLANT
GOWN STRL REUS W/TWL 2XL LVL3 (GOWN DISPOSABLE) ×6 IMPLANT
GOWN STRL REUS W/TWL LRG LVL3 (GOWN DISPOSABLE) ×3 IMPLANT
NEEDLE HYPO 22GX1.5 SAFETY (NEEDLE) ×3 IMPLANT
NS IRRIG 1000ML POUR BTL (IV SOLUTION) ×3 IMPLANT
PACK ABDOMINAL MINOR (CUSTOM PROCEDURE TRAY) ×3 IMPLANT
PROTECTOR NERVE ULNAR (MISCELLANEOUS) ×3 IMPLANT
SPONGE LAP 4X18 X RAY DECT (DISPOSABLE) IMPLANT
SUT VIC AB 0 CT1 27 (SUTURE)
SUT VIC AB 0 CT1 27XBRD ANBCTR (SUTURE) IMPLANT
SUT VIC AB 4-0 PS2 27 (SUTURE) ×3 IMPLANT
SYR CONTROL 10ML LL (SYRINGE) ×3 IMPLANT
TOWEL OR 17X24 6PK STRL BLUE (TOWEL DISPOSABLE) ×6 IMPLANT
TRAY FOLEY CATH SILVER 14FR (SET/KITS/TRAYS/PACK) IMPLANT

## 2016-11-12 NOTE — Progress Notes (Signed)
Patient ID: Amy Franklin, female   DOB: May 01, 1990, 26 y.o.   MRN: [id number] OB Attending  Pt for PP BTL. NPO Private insurance R/Post op care and failure rate reviewed with pt. Pt verbalized understanding and agrees to proceed.

## 2016-11-12 NOTE — Anesthesia Preprocedure Evaluation (Addendum)
Anesthesia Evaluation  Patient identified by MRN, date of birth, ID band Patient awake    Reviewed: Allergy & Precautions, NPO status , Patient's Chart, lab work & pertinent test results  Airway Mallampati: I  TM Distance: >3 FB Neck ROM: Full    Dental no notable dental hx. (+) Teeth Intact   Pulmonary neg pulmonary ROS,    Pulmonary exam normal breath sounds clear to auscultation       Cardiovascular negative cardio ROS Normal cardiovascular exam Rhythm:Regular Rate:Normal     Neuro/Psych negative neurological ROS  negative psych ROS   GI/Hepatic negative GI ROS, Neg liver ROS,   Endo/Other  Obesity  Renal/GU negative Renal ROS  negative genitourinary   Musculoskeletal   Abdominal (+) + obese,   Peds  Hematology negative hematology ROS (+)   Anesthesia Other Findings   Reproductive/Obstetrics Desires sterilization                            Anesthesia Physical Anesthesia Plan  ASA: I  Anesthesia Plan: Spinal   Post-op Pain Management:    Induction:   PONV Risk Score and Plan: 4 or greater and Ondansetron, Dexamethasone, Midazolam, Propofol infusion and Metaclopromide  Airway Management Planned: Natural Airway and Nasal Cannula  Additional Equipment:   Intra-op Plan:   Post-operative Plan:   Informed Consent: I have reviewed the patients History and Physical, chart, labs and discussed the procedure including the risks, benefits and alternatives for the proposed anesthesia with the patient or authorized representative who has indicated his/her understanding and acceptance.   Dental advisory given  Plan Discussed with: CRNA, Anesthesiologist and Surgeon  Anesthesia Plan Comments:        Anesthesia Quick Evaluation

## 2016-11-12 NOTE — Anesthesia Procedure Notes (Signed)
Spinal  Patient location during procedure: OR Start time: 11/12/2016 9:29 AM Staffing Anesthesiologist: Mal AmabileFOSTER, Kimanh Templeman Performed: anesthesiologist  Preanesthetic Checklist Completed: patient identified, site marked, surgical consent, pre-op evaluation, timeout performed, IV checked, risks and benefits discussed and monitors and equipment checked Spinal Block Patient position: sitting Prep: site prepped and draped and DuraPrep Patient monitoring: heart rate, cardiac monitor, continuous pulse ox and blood pressure Approach: midline Location: L3-4 Injection technique: single-shot Needle Needle type: Sprotte  Needle gauge: 24 G Needle length: 9 cm Needle insertion depth: 6 cm Assessment Sensory level: T4 Additional Notes Patient tolerated procedure well. Adequate sensory level.

## 2016-11-12 NOTE — H&P (Signed)
LABOR AND DELIVERY ADMISSION HISTORY AND PHYSICAL NOTE  Aliyanna Bohan is a 26 y.o. female G2P1001 with IUP at [redacted]w[redacted]d by LMP presenting for SOL.  She reports positive fetal movement. She denies leakage of fluid. She wishes to have water birth. She had a water birth with last pregnancy.   Prenatal History/Complications: PNC at [facility] at [facility] Pregnancy complications:  - Positive GBS RV culture  Past Medical History: denies any PMH  Past Surgical History: Past Surgical History:  Procedure Laterality Date  . MOUTH SURGERY    . NO PAST SURGERIES      Obstetrical History: OB History    Gravida Para Term Preterm AB Living   SAB TAB Ectopic Multiple Live Births         0 1      Social History: Social History   Social History  . Marital status: Married    Spouse name: N/A  . Number of children: N/A  . Years of education: N/A   Occupational History  . photographer    Social History Main Topics  . Smoking status: Never Smoker  . Smokeless tobacco: Never Used  . Alcohol use Yes     Comment: occassional when not pregnant  . Drug use: No  . Sexual activity: Yes    Partners: Male    Birth control/ protection: None   Other Topics Concern  . None   Social History Narrative  . None    Family History: Family History  Problem Relation Age of Onset  . Hypertension Father   . Hypertension Paternal Grandfather   . Cancer - Colon Paternal Grandfather   . Diabetes Maternal Grandfather     Allergies: Allergies  Allergen Reactions  . Lactose Intolerance (Gi) Diarrhea and Nausea Only    Causes upset stomach and loose stool.  . Latex Itching  . Penicillins Nausea And Vomiting    Prescriptions Prior to Admission  Medication Sig Dispense Refill Last Dose  . calcium carbonate (TUMS - DOSED IN MG ELEMENTAL CALCIUM) 500 MG chewable tablet Chew 1 tablet by mouth 4 (four) times daily as needed for indigestion or heartburn.    11/11/2016 at Unknown time  . Cholecalciferol  (VITAMIN D-3) 5000 UNITS TABS Take 5,000 Units by mouth daily.   11/11/2016 at Unknown time  . Omega-3 Fatty Acids (FISH OIL) 1000 MG CAPS Take by mouth.   11/11/2016 at Unknown time  . Prenatal MV-Min-Fe Fum-FA-DHA (PRENATAL 1 PO) Take by mouth.   11/11/2016 at Unknown time  . Probiotic CAPS Take 1 capsule by mouth daily.   Past Week at Unknown time  . Misc. Devices (BREAST PUMP) MISC Dispense one breast pump for patient (Patient not taking: Reported on 11/06/2016) 1 each 0 Not Taking     Review of Systems  All systems reviewed and negative except as stated in HPI  Physical Exam Blood pressure 136/86, pulse (!) 106, temperature 98 F (36.7 C), temperature source Oral, resp. rate 18, height 5' (1.524 m), weight 173 lb (78.5 kg), last menstrual period 02/10/2016, currently breastfeeding. General appearance: alert, breathing through contracions Lungs: normal WOB; no respiratory distress Heart: regular rate; peripheral pulses 2+ b/l Abdomen: soft, non-tender; bowel sounds normal Extremities: No calf swelling or tenderness Presentation: cephalic by RN exam Fetal monitoring: baseline rate 150, moderate variability, +acel, no decel Uterine activity: ctx q 2 min Dilation: 5 Effacement (%): 80 Station: -1 Exam by:: alexx gagliardo rn   Prenatal  labs: ABO, Rh: A/POS/-- (02/09 1030) Antibody: NEG (02/09 1030) Rubella: immune RPR: NON REAC (07/12 0803)  HBsAg: NEGATIVE (02/09 1030)  HIV: NONREACTIVE (07/12 0803)  GC/Chlamydia: negative GBS: Negative (08/31 0000)  2-hr GTT: normal Genetic screening:  declined Anatomy US: normal  Prenatal Transfer Tool  Maternal Diabetes: No Genetic Screening: Declined Maternal Ultrasounds/Referrals: Normal Fetal Ultrasounds or other Referrals:  None Maternal Substance Abuse:  No Significant Maternal Medications:  None Significant Maternal Lab Results: None  No results found for this or any previous visit (from the past 24 hour(s)).  Patient Active  Problem List   Diagnosis Date Noted  . GBS (group B Streptococcus carrier), +RV culture, currently pregnant 11/02/2016  . Supervision of normal pregnancy 04/10/2016    Assessment: Marie Birdena CrandallRainey is a 26 y.o. G2P1001 at 8131w3d here for SOL. Would like waterbirth.   #Labor: Expectant management. Midwife notified of waterbirth #FWB: Cat I #ID:  GBS +, Ancef ([facility] with low risk of anaphylaxis) #MOF: breast #MOC: BTL #Circ:  no  Kandra NicolasJulie P Praise Dolecki 11/12/2016, 12:57 AM

## 2016-11-12 NOTE — Op Note (Signed)
Amy Franklin 11/11/2016 - 11/12/2016  PREOPERATIVE DIAGNOSES: Multiparity, undesired fertility  POSTOPERATIVE DIAGNOSES: Multiparity, undesired fertility  PROCEDURE:  Postpartum Bilateral Tubal Sterilization   SURGEON: Dr. Casimiro NeedleMichael L. Paulette Rockford  ANESTHESIA:  Spinal and local analgesia using 10 ml of 0.5% Marcaine  COMPLICATIONS:  None immediate.  ESTIMATED BLOOD LOSS: 10 ml.  FLUIDS: 500 ml LR.  URINE OUTPUT:  As recorded  INDICATIONS:  26 y.o. [id number] with undesired fertility,status post vaginal delivery, desires permanent sterilization.  Other reversible forms of contraception were discussed with patient; she declines all other modalities. Risks of procedure discussed with patient including but not limited to: risk of regret, permanence of method, bleeding, infection, injury to surrounding organs and need for additional procedures.  Failure risk of 1 -2 % with increased risk of ectopic gestation if pregnancy occurs was also discussed with patient.      FINDINGS:  Normal uterus, tubes, and ovaries.  PROCEDURE DETAILS: The patient was taken to the operating room where her epidural anesthesia was dosed up to surgical level and found to be adequate.  She was then placed in the dorsal supine position and prepped and draped in sterile fashion.  After an adequate timeout was performed, attention was turned to the patient's abdomen where a small transverse skin incision was made under the umbilical fold. The incision was taken down to the layer of fascia using the scalpel, and fascia was incised, and extended bilaterally using Mayo scissors. The peritoneum was entered in a sharp fashion. Attention was then turned to the patient's uterus, and left fallopian tube was identified and followed out to the fimbriated end. A midportion of the tube was grasped with Babcock clamp and double ligated with plain cut suture. Ensuing knuckle was excised and sent to pathology. Two ostium were noted as well as  hemostasis. Procedure was repeated on the opposite side in the same fashion.   The instruments were then removed from the patient's abdomen and the fascial incision was repaired with 0 Vicryl, and the skin was closed with a 4-0 Vicryl subcuticular stitch. The patient tolerated the procedure well.  Instrument, sponge, and needle counts were correct times two.  The patient was then taken to the recovery room awake and in stable condition. 10 cc of local was injected at the incision site at the conclusion of the case.   Amy StaggersMichael L [name], MD, FACOG Attending Obstetrician & Gynecologist Faculty Practice, [facility] [place]

## 2016-11-12 NOTE — Lactation Note (Signed)
This note was copied from a baby's chart. Lactation Consultation Note  Patient Name: Boy Yailyn Birdena CrandallRainey Today's Date: 11/12/2016 Reason for consult: Initial assessment Baby at 14 hr of life. Upon entry baby was being passed from one visitor to another while parents sat on the bed watching. Left lactation information and phone number. Instructed parents to call for lactation at the next bf.   Maternal Data    Feeding Feeding Type: Breast Fed Length of feed: 10 min  LATCH Score                   Interventions    Lactation Tools Discussed/Used     Consult Status Consult Status: Follow-up Date: 11/12/16 Follow-up type: In-patient    Rulon Eisenmengerlizabeth E Narciso Stoutenburg 11/12/2016, 7:22 PM

## 2016-11-12 NOTE — Anesthesia Postprocedure Evaluation (Signed)
Anesthesia Post Note  Patient: Amy Franklin  Procedure(s) Performed: Procedure(s) (LRB): POST PARTUM TUBAL LIGATION (Bilateral)     Patient location during evaluation: PACU Anesthesia Type: Spinal Level of consciousness: oriented and awake and alert Pain management: pain level controlled Vital Signs Assessment: post-procedure vital signs reviewed and stable Respiratory status: spontaneous breathing, respiratory function stable and nonlabored ventilation Cardiovascular status: blood pressure returned to baseline and stable Postop Assessment: no headache, no backache, no apparent nausea or vomiting, patient able to bend at knees and spinal receding Anesthetic complications: no    Last Vitals:  Vitals:   11/12/16 1145 11/12/16 1200  BP: (!) 101/57 (!) 102/54  Pulse: 76 65  Resp: 19 17  Temp:    SpO2: 100% 100%    Last Pain:  Vitals:   11/12/16 1200  TempSrc:   PainSc: 0-No pain   Pain Goal: Patients Stated Pain Goal: 3 (11/12/16 1200)               Nelia Rogoff A.

## 2016-11-12 NOTE — Anesthesia Postprocedure Evaluation (Signed)
Anesthesia Post Note  Patient: Kaeley Smay  Procedure(s) Performed: Procedure(s) (LRB): POST PARTUM TUBAL LIGATION (Bilateral)     Patient location during evaluation: Mother Baby Anesthesia Type: Spinal Level of consciousness: awake and alert and oriented Pain management: satisfactory to patient Vital Signs Assessment: post-procedure vital signs reviewed and stable Respiratory status: respiratory function stable and spontaneous breathing Cardiovascular status: blood pressure returned to baseline Postop Assessment: no headache, no backache, spinal receding, patient able to bend at knees and adequate PO intake Anesthetic complications: no    Last Vitals:  Vitals:   11/12/16 1245 11/12/16 1305  BP: 106/66 116/75  Pulse:    Resp: 16 16  Temp:  36.7 C  SpO2:  99%    Last Pain:  Vitals:   11/12/16 1215  TempSrc:   PainSc: 0-No pain   Pain Goal: Patients Stated Pain Goal: 3 (11/12/16 1215)               Karleen DolphinFUSSELL,Amariyana Heacox

## 2016-11-12 NOTE — Addendum Note (Signed)
Addendum  created 11/12/16 1544 by Graciela HusbandsFussell, Myanna Ziesmer O, CRNA   Sign clinical note

## 2016-11-12 NOTE — Transfer of Care (Signed)
Immediate Anesthesia Transfer of Care Note  Patient: Amy Franklin  Procedure(s) Performed: Procedure(s): POST PARTUM TUBAL LIGATION (Bilateral)  Patient Location: PACU  Anesthesia Type:Spinal  Level of Consciousness:  sedated, patient cooperative and responds to stimulation  Airway & Oxygen Therapy:Patient Spontanous Breathing and Patient connected to face mask oxgen  Post-op Assessment:  Report given to PACU RN and Post -op Vital signs reviewed and stable  Post vital signs:  Reviewed and stable  Last Vitals:  Vitals:   11/12/16 0607 11/12/16 0720  BP: 119/68 (!) 112/58  Pulse: 96 85  Resp: 18 18  Temp: 36.7 C 36.9 C    Complications: No apparent anesthesia complications

## 2016-11-13 ENCOUNTER — Encounter: Payer: BLUE CROSS/BLUE SHIELD | Admitting: Obstetrics and Gynecology

## 2016-11-13 ENCOUNTER — Encounter (HOSPITAL_COMMUNITY): Payer: Self-pay | Admitting: Obstetrics and Gynecology

## 2016-11-13 NOTE — Progress Notes (Signed)
Post Partum Day 1 Subjective: G2P2 s/p SVD via water birth and BTL. She is doing well.  Ambulating without issue.  Tolerating regular diet.  Minimal lochia.  No pain.  Breast feeding without issue.   Objective: Blood pressure 109/68, pulse 65, temperature (!) 97.5 F (36.4 C), temperature source Axillary, resp. rate 16, height 5' (1.524 m), weight 78.5 kg (173 lb), last menstrual period 02/10/2016, SpO2 99 %, unknown if currently breastfeeding.  Physical Exam:  General: alert and cooperative Lochia: appropriate Uterine Fundus: firm Incision: dressing clean/dry/intact DVT Evaluation: No evidence of DVT seen on physical exam.   Recent Labs  11/12/16 0050  HGB 13.4  HCT 36.9    Assessment/Plan: G2P2 s/p SVD and BTL, doing well.   Continue routine postpartum care. GBS positive with inadequate prophylaxis intrapartum - infant to be monitored 48 hours.  Plan for discharge tomorrow with infant.    LOS: 1 day   Larene Beach , DO PGY-2 Family Medicine Resident 11/13/2016, 9:25 AM   CNM attestation Post Partum Day #2 I have seen and examined this patient and agree with above documentation in the resident's note.   Amy Franklin is a 26 y.o. [id number] s/p SVD & BTL.  Pt denies problems with ambulating, voiding or po intake. Pain is well controlled.  Plan for birth control is bilateral tubal ligation.  Method of Feeding: breast  PE:  BP 109/68 (BP Location: Right Arm)   Pulse 65   Temp (!) 97.5 F (36.4 C) (Axillary)   Resp 16   Ht 5' (1.524 m)   Wt 78.5 kg (173 lb)   LMP 02/10/2016   SpO2 99%   Breastfeeding? Unknown   BMI 33.79 kg/m  Fundus firm  Plan for discharge: 11/14/16  Cam Hai, CNM 3:31 PM 11/13/2016

## 2016-11-14 DIAGNOSIS — Z302 Encounter for sterilization: Secondary | ICD-10-CM

## 2016-11-14 MED ORDER — IBUPROFEN 600 MG PO TABS: 600.0000 mg | ORAL_TABLET | Freq: Four times a day (QID) | ORAL | 0 refills | Status: DC | PRN

## 2016-11-14 MED ORDER — IBUPROFEN 600 MG PO TABS: 600.0000 mg | ORAL_TABLET | Freq: Four times a day (QID) | ORAL | 0 refills | Status: AC | PRN

## 2016-11-14 NOTE — Lactation Note (Signed)
This note was copied from a baby's chart. Lactation Consultation Note  Patient Name: Amy Franklin Today's Date: 11/14/2016 Reason for consult: Infant weight loss;Follow-up assessment (6% weight loss , exp BF mother ) [name] is 48 hours old,  LC reviewed doc flow sheets and updated per mom  LC instructed mom on the use hand pump and shells.  LC obs latch - see doc flow sheets , depth achieved and multiple swallows,  Per mom breast are fuller. Sore  Nipple and engorgement prevention and tx reviewed  Per mom will have a DEBP at home.  Mother informed of post-discharge support and given phone number to the lactation department, including services for phone call assistance; out-patient appointments; and breastfeeding support group. List of other breastfeeding resources in the community given in the handout. Encouraged mother to call for problems or concerns related to breastfeeding.  Maternal Data Has patient been taught Hand Expression?: Yes (per mom feesl comfortable )  Feeding Feeding Type: Breast Fed Length of feed:  (still feeding at 10 mins , multiple swallows )  LATCH Score Latch: Grasps breast easily, tongue down, lips flanged, rhythmical sucking.  Audible Swallowing: Spontaneous and intermittent  Type of Nipple: Everted at rest and after stimulation  Comfort (Breast/Nipple): Filling, red/small blisters or bruises, mild/mod discomfort  Hold (Positioning): No assistance needed to correctly position infant at breast.  LATCH Score: 9  Interventions Interventions: Breast feeding basics reviewed;Breast compression;Adjust position;Shells;Hand pump  Lactation Tools Discussed/Used Tools: Shells;Flanges;Pump Flange Size: 24 Shell Type: Inverted Breast pump type: Manual WIC Program: No Pump Review: Setup, frequency, and cleaning Initiated by:: MAI  Date initiated:: 11/14/16   Consult Status Consult Status: Complete Date: 11/14/16    Kathrin Greathouse 11/14/2016, 9:33  AM

## 2016-11-14 NOTE — Discharge Instructions (Signed)

## 2016-11-14 NOTE — Discharge Summary (Signed)
OB Discharge Summary     Patient Name: Amy Franklin DOB: [birth year] MRN: [id number]  Date of admission: 11/11/2016 Delivering MD: Donette Larry   Date of discharge: 11/14/2016  Admitting diagnosis: 39 WEEKS CTX undesired fertility Intrauterine pregnancy: 106w3d     Secondary diagnosis:  Active Problems:   Spontaneous vaginal delivery   GBS (group B Streptococcus carrier), +RV culture, currently pregnant   Indication for care in labor or delivery   Encounter for tubal ligation  Additional problems:  Patient Active Problem List   Diagnosis Date Noted  . Encounter for tubal ligation 11/14/2016  . Indication for care in labor or delivery 11/12/2016  . GBS (group B Streptococcus carrier), +RV culture, currently pregnant 11/02/2016  . Supervision of normal pregnancy 04/10/2016  . Spontaneous vaginal delivery 01/17/2014        Discharge diagnosis: Term Pregnancy Delivered and s/p Bilateral Tubal Ligation                                                                                                Post partum procedures:postpartum tubal ligation  Augmentation: None  Complications: None  Hospital course:  Onset of Labor With Vaginal Delivery     26 y.o. yo [id number] at [redacted]w[redacted]d was admitted in Active Labor on 11/11/2016.  She underwent a natural water birth.  Patient had an uncomplicated labor course as follows:  Membrane Rupture Time/Date: 1:45 AM ,11/12/2016   Intrapartum Procedures: Episiotomy: None [1]                                         Lacerations:  Labial [10]  Patient had a delivery of a Viable infant. 11/12/2016  Information for the patient's newborn:  Amy, Leckrone Franklin [[id number]]  Delivery Method: Vaginal, Spontaneous Delivery (Filed from Delivery Summary)    Pateint had an uncomplicated postpartum course.  She underwent bilateral tubal ligation without complication.  She is ambulating, tolerating a regular diet, passing flatus, and urinating well. Patient is  discharged home in stable condition on 11/14/16.   Physical exam  Vitals:   11/13/16 0523 11/13/16 0524 11/13/16 1828 11/14/16 0515  BP:  109/68 114/71 111/63  Pulse:  65 80 64  Resp:  Temp: (!) 97.5 F (36.4 C) (!) 97.5 F (36.4 C) 98 F (36.7 C) 97.9 F (36.6 C)  TempSrc: Oral Axillary Oral Oral  SpO2:   100%   Weight:      Height:       General: alert, cooperative and no distress Lochia: appropriate Uterine Fundus: firm Incision: Healing well with no significant drainage, No significant erythema, Dressing is clean, dry, and intact DVT Evaluation: No evidence of DVT seen on physical exam. Labs: Lab Results  Component Value Date   WBC 16.0 (H) 11/12/2016   HGB 13.4 11/12/2016   HCT 36.9 11/12/2016   MCV 84.1 11/12/2016   PLT 169 11/12/2016   CMP Latest Ref Rng & Units 04/10/2016  Glucose 65 - 99 mg/dL 84  Discharge instruction: per After Visit Summary and "Baby and Me Booklet".  After visit meds:  Allergies as of 11/14/2016      Reactions   Lactose Intolerance (gi) Diarrhea, Nausea Only   Causes upset stomach and loose stool.   Latex Itching   Penicillins Nausea And Vomiting   Has patient had a PCN reaction causing immediate rash, facial/tongue/throat swelling, SOB or lightheadedness with hypotension: No Has patient had a PCN reaction causing severe rash involving mucus membranes or skin necrosis: No Has patient had a PCN reaction that required hospitalization: No Has patient had a PCN reaction occurring within the last 10 years: No If all of the above answers are "NO", then may proceed with Cephalosporin use.      Medication List    STOP taking these medications   Breast Pump Misc     TAKE these medications   acetaminophen 500 MG tablet Commonly known as:  TYLENOL Take 500 mg by mouth every 6 (six) hours as needed for mild pain or headache.   calcium carbonate 500 MG chewable tablet Commonly known as:  TUMS - dosed in mg elemental  calcium Chew 1 tablet by mouth 4 (four) times daily as needed for indigestion or heartburn.   Fish Oil 1000 MG Caps Take 1 capsule by mouth daily.   ibuprofen 600 MG tablet Commonly known as:  ADVIL,MOTRIN Take 1 tablet (600 mg total) by mouth every 6 (six) hours as needed for moderate pain or cramping.   PRENATAL 1 PO Take 1 tablet by mouth daily.   Probiotic Caps Take 1 capsule by mouth daily.   Vitamin D-3 5000 units Tabs Take 5,000 Units by mouth daily.            Discharge Care Instructions        Start     Ordered   11/14/16 0000  ibuprofen (ADVIL,MOTRIN) 600 MG tablet  Every 6 hours PRN     11/14/16 0642   11/12/16 0000  OB RESULT CONSOLE Group B Strep    Comments:  This external order was created through the Results Console.   11/12/16 0051   11/12/16 0000  OB RESULTS CONSOLE GC/Chlamydia    Comments:  This external order was created through the Results Console.   11/12/16 0051      Diet: routine diet  Activity: Advance as tolerated. Pelvic rest for 6 weeks.   Outpatient follow up:6 weeks Follow up Appt:Future Appointments Date Time Provider Department Center  12/18/2016 8:30 AM Aviva Signs, CNM [facility]   Follow up Visit:No Follow-up on file.  Postpartum contraception: Tubal Ligation  Newborn Data: Live born female  Birth Weight: 6 lb 12.1 oz (3065 g) APGAR: 8, 9  Baby Feeding: Breast Disposition:home with mother   11/14/2016 Larene Beach, DO PGY-2  Family Medicine Resident    Midwife attestation I have seen and examined this patient and agree with above documentation in the resident's note.   Amy Franklin is a 26 y.o. [id number] s/p SVD and BTL.   Pain is well controlled.  Plan for birth control is bilateral tubal ligation.  Method of Feeding: breast  PE:  Gen: well appearing Heart: reg rate Lungs: normal WOB Fundus firm Ext: soft, no pain, no edema   Recent Labs  11/12/16 0050  HGB 13.4  HCT 36.9      Assessment - discharge today  Plan: - postpartum care discussed - f/u clinic in 6 weeks for postpartum visit   Zamauri Nez  Denyse Amass, CNM 12:31 PM

## 2016-12-17 NOTE — Progress Notes (Signed)
Post Partum Exam  Amy Franklin is a 26 y.o. 422P2002 female who presents for a postpartum visit. She is 4 weeks postpartum following a spontaneous vaginal water birth  Delivery. Pt did undergo BTS next PP day.  I have fully reviewed the prenatal and intrapartum course. The delivery was at 8252w3d gestational weeks.  Anesthesia: none. Postpartum course has been unremarkable. Baby's course has been unremarkable. Baby is feeding by breast. Bleeding no bleeding. Bowel function is normal. Bladder function is normal. Patient is sexually active. Contraception method is tubal ligation. Postpartum depression screening:neg  The following portions of the patient's history were reviewed and updated as appropriate: allergies, current medications, past family history, past medical history, past social history, past surgical history and problem list.  Review of Systems Pertinent items noted in HPI and remainder of comprehensive ROS otherwise negative.    Objective:  unknown if currently breastfeeding.  General:  alert and cooperative   Breasts:  negative  Lungs: clear to auscultation bilaterally  Heart:  regular rate and rhythm  Abdomen: soft, non-tender; bowel sounds normal; no masses,  no organomegaly; removed small piece of suture from left BTL incision   Vulva:  not evaluated  Patient declined pelvic exam today  Vagina: not evaluated  Cervix:  not evaluated  Corpus: not examined  Adnexa:  not evaluated  Rectal Exam: Not performed.        Assessment:    Nml postpartum exam. Pap smear due 2021  Plan:   1. Contraception: tubal ligation 2. Routine health maintenace yearly 3. Follow up in: 1 year or as needed.

## 2016-12-18 ENCOUNTER — Ambulatory Visit (INDEPENDENT_AMBULATORY_CARE_PROVIDER_SITE_OTHER): Payer: BLUE CROSS/BLUE SHIELD | Admitting: Obstetrics & Gynecology

## 2016-12-18 ENCOUNTER — Encounter: Payer: Self-pay | Admitting: Obstetrics & Gynecology

## 2016-12-18 DIAGNOSIS — Z9851 Tubal ligation status: Secondary | ICD-10-CM

## 2016-12-18 DIAGNOSIS — Z1389 Encounter for screening for other disorder: Secondary | ICD-10-CM | POA: Diagnosis not present

## 2017-10-28 NOTE — Progress Notes (Signed)
[facility] [address] [place], [place] [address] Phone: [phone] Subjective:     I Ronelle Nigh am serving as a Neurosurgeon for Dr. Antoine Primas.  CC: Low back pain  BJY:[id number]  Amy Franklin is a 27 y.o. female coming in with complaint of low back pain. Power lifter. Numbness down the back of her leg.   Onset- 1 month Location- left lower back  Duration- consistent pain all day Character- Dull and sharp Aggravating factors- Flexion Reliving factors-  Therapies tried- Ice  Severity-     Past Medical History:  Diagnosis Date  . Medical history non-contributory    Past Surgical History:  Procedure Laterality Date  . MOUTH SURGERY    . NO PAST SURGERIES    . TUBAL LIGATION Bilateral 11/12/2016   Procedure: POST PARTUM TUBAL LIGATION;  Surgeon: Hermina Staggers, MD;  Location: [facility];  Service: Obstetrics;  Laterality: Bilateral;   Social History   Socioeconomic History  . Marital status: Married    Spouse name: Not on file  . Number of children: Not on file  . Years of education: Not on file  . Highest education level: Not on file  Occupational History  . Occupation: Environmental manager  Social Needs  . Financial resource strain: Not on file  . Food insecurity:    Worry: Not on file    Inability: Not on file  . Transportation needs:    Medical: Not on file    Non-medical: Not on file  Tobacco Use  . Smoking status: Never Smoker  . Smokeless tobacco: Never Used  Substance and Sexual Activity  . Alcohol use: Yes    Comment: occassional when not pregnant  . Drug use: No  . Sexual activity: Yes    Partners: Male    Birth control/protection: None, Surgical  Lifestyle  . Physical activity:    Days per week: Not on file    Minutes per session: Not on file  . Stress: Not on file  Relationships  . Social connections:    Talks on phone: Not on file    Gets together: Not on file    Attends religious service: Not on file    Active  member of club or organization: Not on file    Attends meetings of clubs or organizations: Not on file    Relationship status: Not on file  Other Topics Concern  . Not on file  Social History Narrative  . Not on file   Allergies  Allergen Reactions  . Lactose Intolerance (Gi) Diarrhea and Nausea Only    Causes upset stomach and loose stool.  . Latex Itching  . Penicillins Nausea And Vomiting    Has patient had a PCN reaction causing immediate rash, facial/tongue/throat swelling, SOB or lightheadedness with hypotension: No Has patient had a PCN reaction causing severe rash involving mucus membranes or skin necrosis: No Has patient had a PCN reaction that required hospitalization: No Has patient had a PCN reaction occurring within the last 10 years: No If all of the above answers are "NO", then may proceed with Cephalosporin use.   Family History  Problem Relation Age of Onset  . Hypertension Father   . Hypertension Paternal Grandfather   . Cancer - Colon Paternal Grandfather   . Diabetes Maternal Grandfather        Current Outpatient Medications (Analgesics):  .  ibuprofen (ADVIL,MOTRIN) 600 MG tablet, Take 1 tablet (600 mg total) by mouth every 6 (six)  hours as needed for moderate pain or cramping. Marland Kitchen.  acetaminophen (TYLENOL) 500 MG tablet, Take 500 mg by mouth every 6 (six) hours as needed for mild pain or headache.   Current Outpatient Medications (Other):  Marland Kitchen.  Cholecalciferol (VITAMIN D-3) 5000 UNITS TABS, Take 5,000 Units by mouth daily. .  Omega-3 Fatty Acids (FISH OIL) 1000 MG CAPS, Take 1 capsule by mouth daily.  .  Prenatal MV-Min-Fe Fum-FA-DHA (PRENATAL 1 PO), Take 1 tablet by mouth daily.  .  Probiotic CAPS, Take 1 capsule by mouth daily. .  calcium carbonate (TUMS - DOSED IN MG ELEMENTAL CALCIUM) 500 MG chewable tablet, Chew 1 tablet by mouth 4 (four) times daily as needed for indigestion or heartburn.     Past medical history, social, surgical and family history  all reviewed in electronic medical record.  No pertanent information unless stated regarding to the chief complaint.   Review of Systems:  No headache, visual changes, nausea, vomiting, diarrhea, constipation, dizziness, abdominal pain, skin rash, fevers, chills, night sweats, weight loss, swollen lymph nodes, body aches, joint swelling, chest pain, shortness of breath, mood changes.  Positive muscle aches  Objective  Blood pressure 110/74, pulse 75, height 5' (1.524 m), weight 131 lb (59.4 kg), SpO2 99 %, currently breastfeeding.    General: No apparent distress alert and oriented x3 mood and affect normal, dressed appropriately.  HEENT: Pupils equal, extraocular movements intact  Respiratory: Patient's speak in full sentences and does not appear short of breath  Cardiovascular: No lower extremity edema, non tender, no erythema  Skin: Warm dry intact with no signs of infection or rash on extremities or on axial skeleton.  Abdomen: Soft nontender  Neuro: Cranial nerves II through XII are intact, neurovascularly intact in all extremities with 2+ DTRs and 2+ pulses.  Lymph: No lymphadenopathy of posterior or anterior cervical chain or axillae bilaterally.  Gait normal with good balance and coordination.  MSK:  Non tender with full range of motion and good stability and symmetric strength and tone of shoulders, elbows, wrist, hip, knee and ankles bilaterally.  Back Exam:  Inspection: Mild increase in lordosis Motion: Flexion 40 deg tightness of the hamstrings, Extension 45 deg, Side Bending to 45 deg bilaterally,  Rotation to 45 deg bilaterally  SLR laying: Negative  XSLR laying: Negative  Palpable tenderness: Tender over the left sacroiliac joint. FABER: negative. Sensory change: Gross sensation intact to all lumbar and sacral dermatomes.  Reflexes: 2+ at both patellar tendons, 2+ at achilles tendons, Babinski's downgoing.  Strength at foot  Plantar-flexion: 5/5 Dorsi-flexion: 5/5  Eversion: 5/5 Inversion: 5/5  Leg strength  Quad: 5/5 Hamstring: 5/5 Hip flexor: 4+/5 Hip abductors: 5/5  Gait unremarkable.   Osteopathic findings C2 flexed rotated and side bent right T3 extended rotated and side bent right inhaled third rib T9 extended rotated and side bent left L2 flexed rotated and side bent right Sacrum left on left     Impression and Recommendations:     This case required medical decision making of moderate complexity. The above documentation has been reviewed and is accurate and complete Judi SaaZachary M Denson Niccoli, DO       Note: This dictation was prepared with Dragon dictation along with smaller phrase technology. Any transcriptional errors that result from this process are unintentional.

## 2017-10-29 ENCOUNTER — Encounter: Payer: Self-pay | Admitting: Family Medicine

## 2017-10-29 ENCOUNTER — Ambulatory Visit (INDEPENDENT_AMBULATORY_CARE_PROVIDER_SITE_OTHER): Payer: Self-pay | Admitting: Family Medicine

## 2017-10-29 VITALS — BP 110/74 | HR 75 | Ht 60.0 in | Wt 131.0 lb

## 2017-10-29 DIAGNOSIS — M533 Sacrococcygeal disorders, not elsewhere classified: Secondary | ICD-10-CM | POA: Insufficient documentation

## 2017-10-29 DIAGNOSIS — M999 Biomechanical lesion, unspecified: Secondary | ICD-10-CM | POA: Insufficient documentation

## 2017-10-29 NOTE — Assessment & Plan Note (Signed)
Noted on the left side.  Home exercises given.  Work with Event organiserathletic trainer, trial of topical anti-inflammatories, icing regimen.  Discussed which activities to do which wants to avoid.  Discussed over-the-counter medications.  Slow increase in lifting over the course of next several weeks.  Follow-up again in 4 to 6 weeks.  Responded well to manipulation

## 2017-10-29 NOTE — Assessment & Plan Note (Signed)
Decision today to treat with OMT was based on Physical Exam  After verbal consent patient was treated with HVLA, ME, FPR techniques in cervical, thoracic, lumbar and sacral areas  Patient tolerated the procedure well with improvement in symptoms  Patient given exercises, stretches and lifestyle modifications  See medications in patient instructions if given  Patient will follow up in 4-6 weeks 

## 2017-10-29 NOTE — Patient Instructions (Signed)
Good to see you  Si joint Ice 20 minutes 2 times daily. Usually after activity and before bed. pennsaid pinkie amount topically 2 times daily as needed.  Exercises 3 times a week.   Vitamin D 2000 IU dialy  Turmeric 500mg  daily  Tart cherry extract any dose at night Ok to lift but 50% of weight and more machine weights then increase 10% a week.  If pain then take a step back  See me again in 4-6 weeks

## 2018-01-11 IMAGING — US US MFM OB COMP +14 WKS
1 series · 14 of 28 positions shown · non-contrast
Comparison: none

[Series 1: us mfm ob comp +14 wks · 73 acquisitions, 14 frames shown]
[im 3/73]
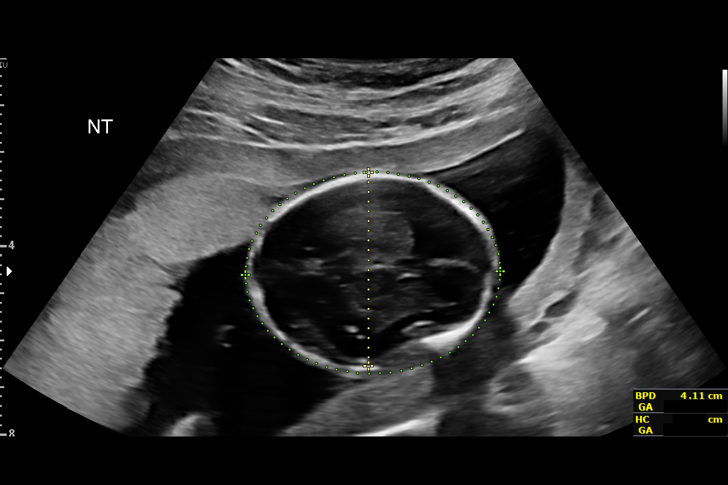
[im 9/73]
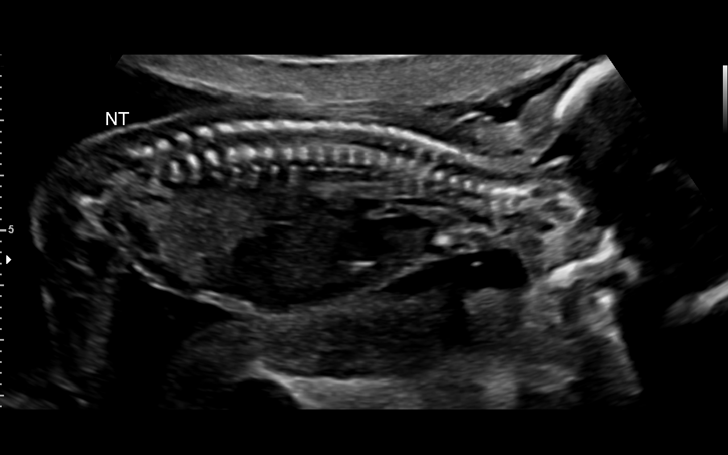
[im 14/73]
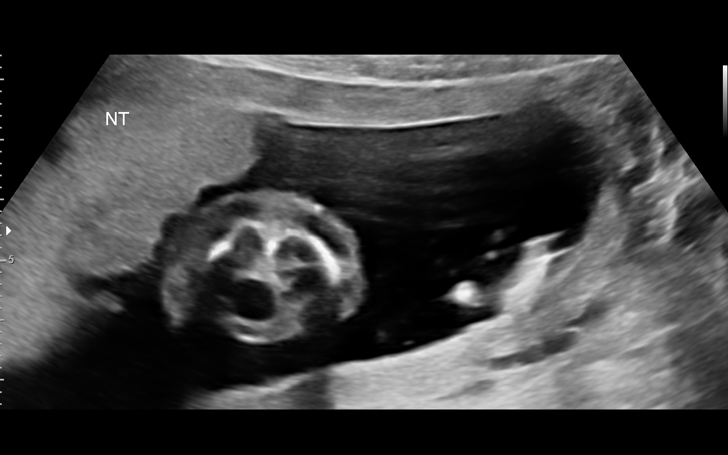
[im 19/73]
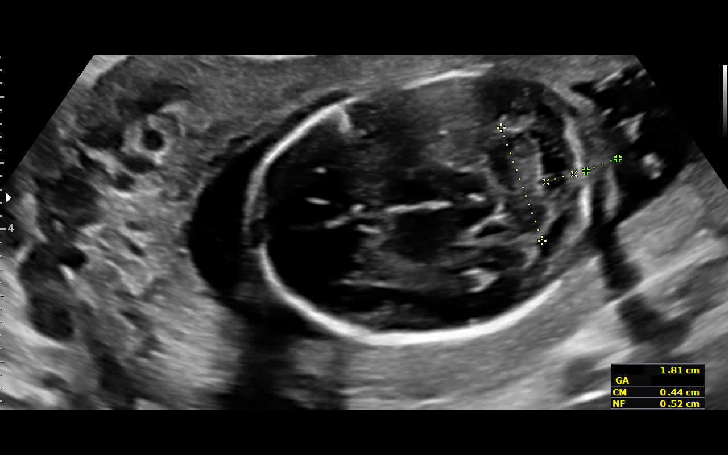
[im 25/73]
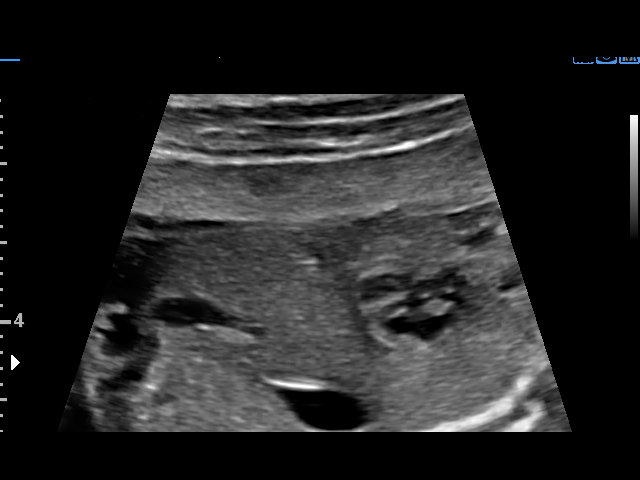
[im 30/73]
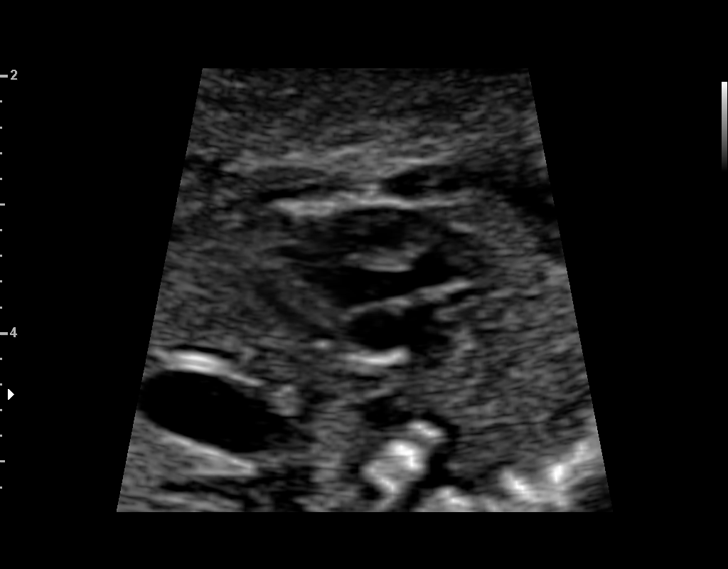
[im 35/73]
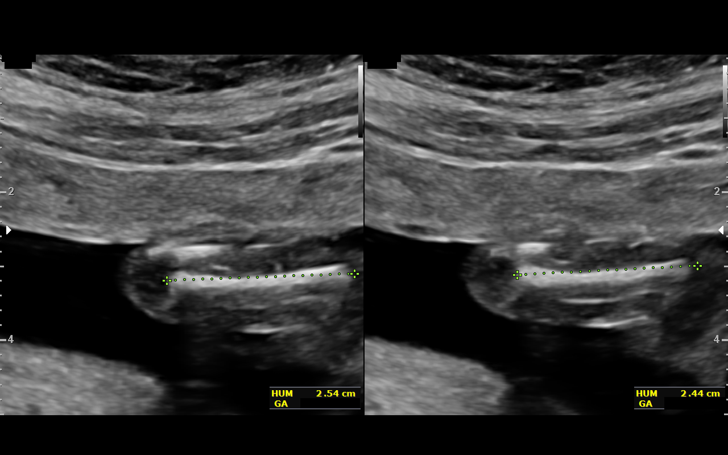
[im 41/73]
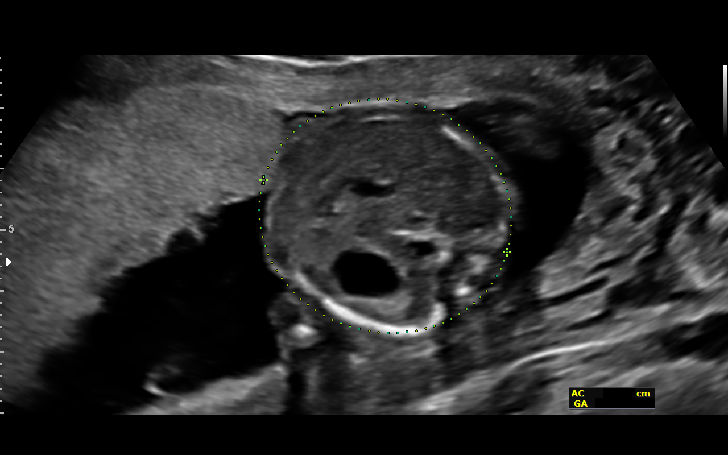
[im 46/73]
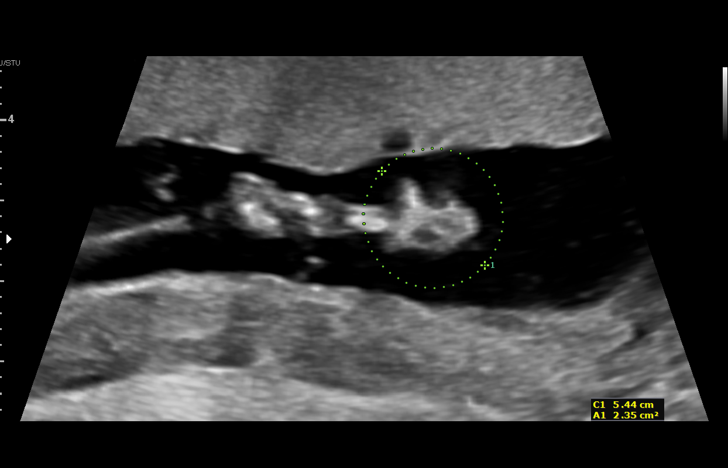
[im 51/73]
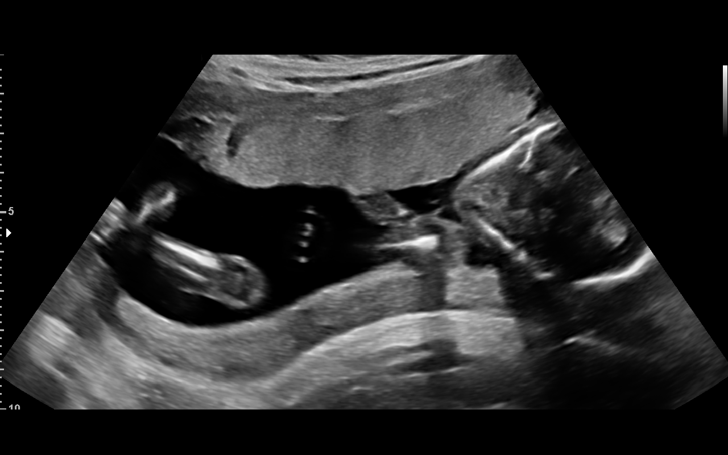
[im 57/73]
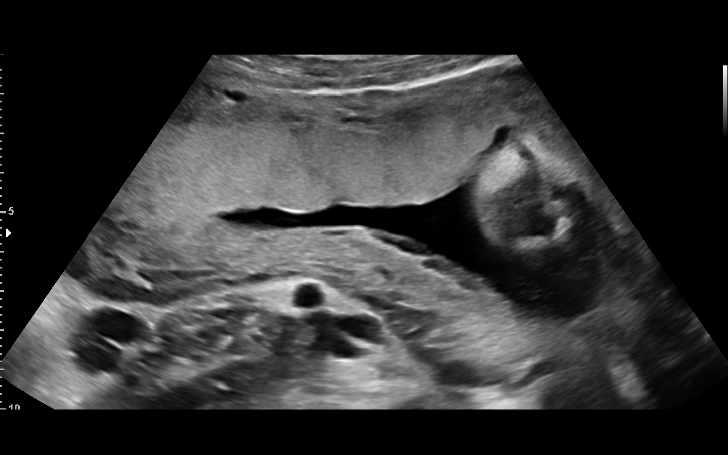
[im 62/73]
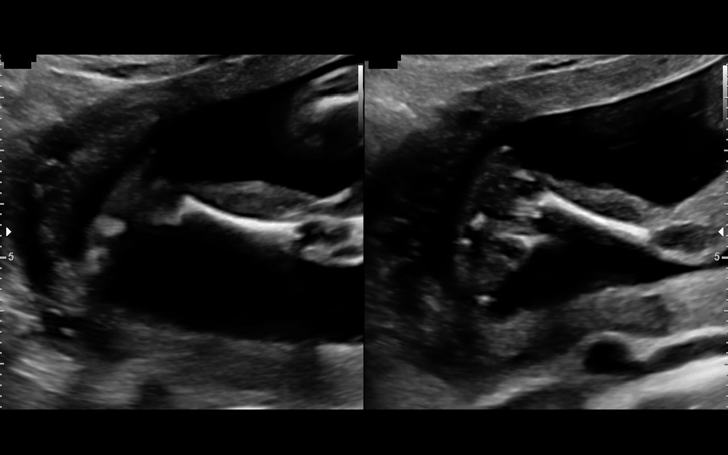
[im 67/73]
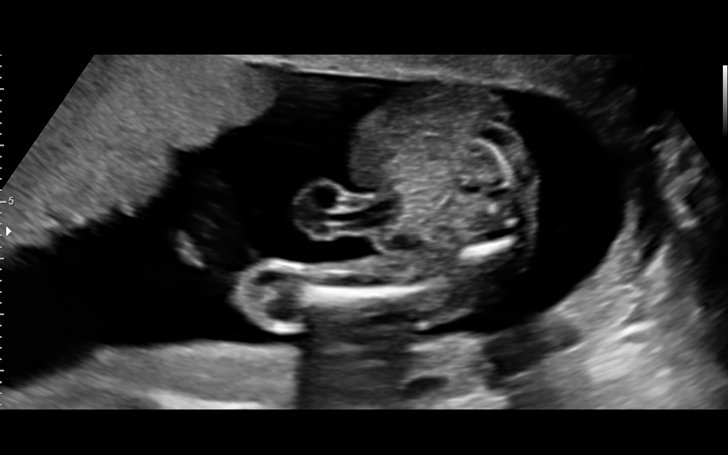
[im 73/73]
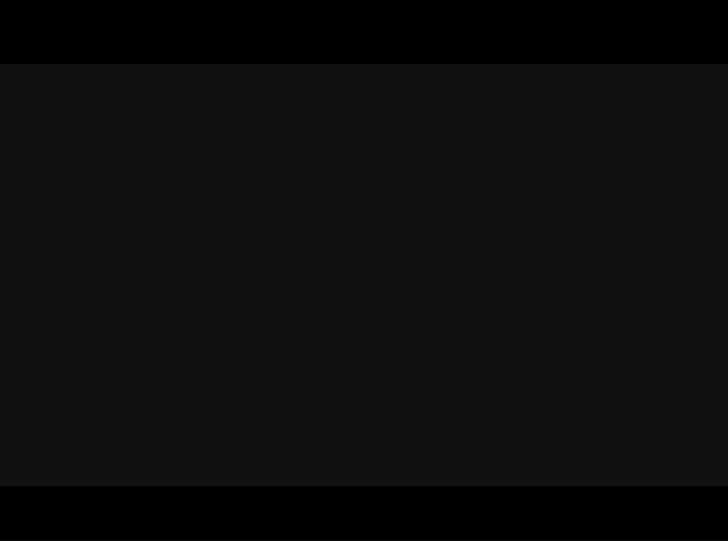

[14 of 28 positions shown; findings below may reference images not displayed]

1  LYKZ RUCHAL           [id number]      [id number]     [id number]
Indications

18 weeks gestation of pregnancy
Encounter for antenatal screening for
malformations
OB History

Gravidity:    2         Term:   1        Prem:   0        SAB:   0
TOP:          0       Ectopic:  0        Living: 1
Fetal Evaluation

Num Of Fetuses:     1
Fetal Heart         151
Rate(bpm):
Cardiac Activity:   Observed
Presentation:       Cephalic
Placenta:           Anterior, above cervical os
P. Cord Insertion:  Visualized

Amniotic Fluid
AFI FV:      Subjectively within normal limits

Largest Pocket(cm)
4.6
Biometry

BPD:      39.6  mm     G. Age:  18w 0d         27  %    CI:         72.1   %    70 - 86
FL/HC:      16.8   %    16.1 -
HC:      148.4  mm     G. Age:  18w 0d         17  %    HC/AC:      1.14        1.09 -
AC:      129.7  mm     G. Age:  18w 4d         45  %    FL/BPD:     63.1   %
FL:         25  mm     G. Age:  17w 4d         13  %    FL/AC:      19.3   %    20 - 24
HUM:      24.9  mm     G. Age:  17w 6d         30  %
CER:      18.1  mm     G. Age:  18w 0d         32  %
NFT:       5.2  mm

CM:        4.4  mm

Est. FW:     222  gm      0 lb 8 oz     35  %
Gestational Age

LMP:           18w 4d        Date:  02/10/16                 EDD:   11/16/16
U/S Today:     18w 0d                                        EDD:   11/20/16
Best:          18w 4d     Det. By:  LMP  (02/10/16)          EDD:   11/16/16
Anatomy

Cranium:               Appears normal         Aortic Arch:            Appears normal
Cavum:                 Appears normal         Ductal Arch:            Appears normal
Ventricles:            Appears normal         Diaphragm:              Appears normal
Choroid Plexus:        Appears normal         Stomach:                Appears normal, left
sided
Cerebellum:            Appears normal         Abdomen:                Appears normal
Posterior Fossa:       Appears normal         Abdominal Wall:         Appears nml (cord
insert, abd wall)
Nuchal Fold:           Appears normal         Cord Vessels:           Appears normal (3
vessel cord)
Face:                  Orbits appear          Kidneys:                Appear normal
normal
Lips:                  Appears normal         Bladder:                Appears normal
Thoracic:              Appears normal         Spine:                  Appears normal
Heart:                 Not well visualized    Upper Extremities:      Appears normal
RVOT:                  Not well visualized    Lower Extremities:      Appears normal
LVOT:                  Not well visualized

Other:  Gender not well visualized.
Cervix Uterus Adnexa

Cervix
Length:           3.34  cm.
Normal appearance by transabdominal scan.

Uterus
No abnormality visualized.

Left Ovary
Not visualized. No adnexal mass visualized.

Right Ovary
Not visualized. No adnexal mass visualized.
Impression

Single living intrauterine pregnancy at 16w3d.
Anterior placenta without evidence of previa.
Appropriate fetal growth.
Normal amniotic fluid volume.
The fetal anatomic survey is not complete.
No gross fetal anomalies identified.
Recommendations

Recommend follow-up ultrasound examination in 4-6 weeks
for completion of fetal anatomic survey.

## 2018-01-31 ENCOUNTER — Ambulatory Visit (INDEPENDENT_AMBULATORY_CARE_PROVIDER_SITE_OTHER): Payer: Self-pay | Admitting: Family Medicine

## 2018-01-31 ENCOUNTER — Encounter: Payer: Self-pay | Admitting: Family Medicine

## 2018-01-31 VITALS — BP 100/82 | HR 90 | Ht 60.0 in | Wt 125.0 lb

## 2018-01-31 DIAGNOSIS — M999 Biomechanical lesion, unspecified: Secondary | ICD-10-CM

## 2018-01-31 DIAGNOSIS — M533 Sacrococcygeal disorders, not elsewhere classified: Secondary | ICD-10-CM

## 2018-01-31 NOTE — Progress Notes (Signed)
[facility] Shahab Polhamus D.O. [facility] [address] [place], [place] [address] Phone: [phone] Subjective:    I Amy NighKana Thompson am serving as a Neurosurgeonscribe for Dr. Antoine PrimasZachary Efraim Vanallen.   CC: Back pain follow-up  KGM:[id number]:Subjective  Soleia Birdena CrandallRainey is a 27 y.o. female coming in with complaint of SI joint pain. States that she is doing better than her last visit.  Been 3 months.  Not lifting his heavy weights that she was doing previously.  Patient denies any new symptoms such as numbness or tingling.  One time where she did have an exacerbation around the left sacroiliac joint with mild radiation of pain but then seemed to go away fairly quickly.     Past Medical History:  Diagnosis Date  . Medical history non-contributory    Past Surgical History:  Procedure Laterality Date  . MOUTH SURGERY    . NO PAST SURGERIES    . TUBAL LIGATION Bilateral 11/12/2016   Procedure: POST PARTUM TUBAL LIGATION;  Surgeon: Hermina StaggersErvin, Michael L, MD;  Location: [facility];  Service: Obstetrics;  Laterality: Bilateral;   Social History   Socioeconomic History  . Marital status: Married    Spouse name: Not on file  . Number of children: Not on file  . Years of education: Not on file  . Highest education level: Not on file  Occupational History  . Occupation: Environmental managerphotographer  Social Needs  . Financial resource strain: Not on file  . Food insecurity:    Worry: Not on file    Inability: Not on file  . Transportation needs:    Medical: Not on file    Non-medical: Not on file  Tobacco Use  . Smoking status: Never Smoker  . Smokeless tobacco: Never Used  Substance and Sexual Activity  . Alcohol use: Yes    Comment: occassional when not pregnant  . Drug use: No  . Sexual activity: Yes    Partners: Male    Birth control/protection: None, Surgical  Lifestyle  . Physical activity:    Days per week: Not on file    Minutes per session: Not on file  . Stress: Not on file  Relationships  . Social connections:   Talks on phone: Not on file    Gets together: Not on file    Attends religious service: Not on file    Active member of club or organization: Not on file    Attends meetings of clubs or organizations: Not on file    Relationship status: Not on file  Other Topics Concern  . Not on file  Social History Narrative  . Not on file   Allergies  Allergen Reactions  . Lactose Intolerance (Gi) Diarrhea and Nausea Only    Causes upset stomach and loose stool.  . Latex Itching  . Penicillins Nausea And Vomiting    Has patient had a PCN reaction causing immediate rash, facial/tongue/throat swelling, SOB or lightheadedness with hypotension: No Has patient had a PCN reaction causing severe rash involving mucus membranes or skin necrosis: No Has patient had a PCN reaction that required hospitalization: No Has patient had a PCN reaction occurring within the last 10 years: No If all of the above answers are "NO", then may proceed with Cephalosporin use.   Family History  Problem Relation Age of Onset  . Hypertension Father   . Hypertension Paternal Grandfather   . Cancer - Colon Paternal Grandfather   . Diabetes Maternal Grandfather        Current  Outpatient Medications (Analgesics):  .  acetaminophen (TYLENOL) 500 MG tablet, Take 500 mg by mouth every 6 (six) hours as needed for mild pain or headache. .  ibuprofen (ADVIL,MOTRIN) 600 MG tablet, Take 1 tablet (600 mg total) by mouth every 6 (six) hours as needed for moderate pain or cramping.   Current Outpatient Medications (Other):  .  calcium carbonate (TUMS - DOSED IN MG ELEMENTAL CALCIUM) 500 MG chewable tablet, Chew 1 tablet by mouth 4 (four) times daily as needed for indigestion or heartburn.  .  Cholecalciferol (VITAMIN D-3) 5000 UNITS TABS, Take 5,000 Units by mouth daily. .  Omega-3 Fatty Acids (FISH OIL) 1000 MG CAPS, Take 1 capsule by mouth daily.  .  Prenatal MV-Min-Fe Fum-FA-DHA (PRENATAL 1 PO), Take 1 tablet by mouth daily.  .   Probiotic CAPS, Take 1 capsule by mouth daily.    Past medical history, social, surgical and family history all reviewed in electronic medical record.  No pertanent information unless stated regarding to the chief complaint.   Review of Systems:  No headache, visual changes, nausea, vomiting, diarrhea, constipation, dizziness, abdominal pain, skin rash, fevers, chills, night sweats, weight loss, swollen lymph nodes, body aches, joint swelling,, chest pain, shortness of breath, mood changes.  Positive muscle aches  Objective  Blood pressure 100/82, pulse 90, height 5' (1.524 m), weight 125 lb (56.7 kg), SpO2 96 %, currently breastfeeding.   General: No apparent distress alert and oriented x3 mood and affect normal, dressed appropriately.  HEENT: Pupils equal, extraocular movements intact  Respiratory: Patient's speak in full sentences and does not appear short of breath  Cardiovascular: No lower extremity edema, non tender, no erythema  Skin: Warm dry intact with no signs of infection or rash on extremities or on axial skeleton.  Abdomen: Soft nontender  Neuro: Cranial nerves II through XII are intact, neurovascularly intact in all extremities with 2+ DTRs and 2+ pulses.  Lymph: No lymphadenopathy of posterior or anterior cervical chain or axillae bilaterally.  Gait normal with good balance and coordination.  MSK:  Non tender with full range of motion and good stability and symmetric strength and tone of shoulders, elbows, wrist, hip, knee and ankles bilaterally.  Back Exam:  Inspection: Unremarkable  Motion: Flexion 40 deg, Extension 25 deg, Side Bending to 35 deg bilaterally,  Rotation to 45 deg bilaterally  SLR laying: Negative  XSLR laying: Negative  Palpable tenderness: Tender to palpation of the paraspinal musculature lumbar spine left greater than right. FABER: Positive left. Sensory change: Gross sensation intact to all lumbar and sacral dermatomes.  Reflexes: 2+ at both  patellar tendons, 2+ at achilles tendons, Babinski's downgoing.  Strength at foot  Plantar-flexion: 5/5 Dorsi-flexion: 5/5 Eversion: 5/5 Inversion: 5/5  Leg strength  Quad: 5/5 Hamstring: 5/5 Hip flexor: 5/5 Hip abductors: 5/5  Gait unremarkable.     Osteopathic findings  T9 extended rotated and side bent left L2 flexed rotated and side bent right Sacrum left on left  Impression and Recommendations:     This case required medical decision making of moderate complexity. The above documentation has been reviewed and is accurate and complete Judi Saa, DO       Note: This dictation was prepared with Dragon dictation along with smaller phrase technology. Any transcriptional errors that result from this process are unintentional.

## 2018-01-31 NOTE — Assessment & Plan Note (Signed)
Patient continues to have some difficulty.  Responds well though to manipulation.  Discussed icing regimen and home exercises.  Patient will continue with core strengthening.  Follow-up with me again in 3 to 4 months.

## 2018-01-31 NOTE — Assessment & Plan Note (Signed)
Decision today to treat with OMT was based on Physical Exam  After verbal consent patient was treated with HVLA, ME, FPR techniques in cervical, thoracic, lumbar and sacral areas  Patient tolerated the procedure well with improvement in symptoms  Patient given exercises, stretches and lifestyle modifications  See medications in patient instructions if given  Patient will follow up in 1-2 weeks 

## 2018-01-31 NOTE — Patient Instructions (Signed)
Good to see you  pennsaid pinkie amount topically 2 times daily as needed.   Ice 20 minutes 2 times daily. Usually after activity and before bed. Exercises 3 times a week.  Stay active but drop weight if doing deep squats or deadlifts See em again in 3-4 months or call us at [phone] if you need me sooner!

## 2018-10-14 ENCOUNTER — Encounter: Payer: Self-pay | Admitting: Family Medicine
